# Patient Record
Sex: Male | Born: 1945 | Race: White | Hispanic: No | Marital: Married | Smoking: Former smoker
Health system: Southern US, Community
[De-identification: ages and names within clinical notes are randomized; demographics above are authoritative.]

## PROBLEM LIST (undated history)

## (undated) DIAGNOSIS — I350 Nonrheumatic aortic (valve) stenosis: Secondary | ICD-10-CM

## (undated) DIAGNOSIS — Z87442 Personal history of urinary calculi: Secondary | ICD-10-CM

## (undated) DIAGNOSIS — I2510 Atherosclerotic heart disease of native coronary artery without angina pectoris: Secondary | ICD-10-CM

## (undated) DIAGNOSIS — R519 Headache, unspecified: Secondary | ICD-10-CM

## (undated) DIAGNOSIS — R9431 Abnormal electrocardiogram [ECG] [EKG]: Secondary | ICD-10-CM

## (undated) DIAGNOSIS — G5600 Carpal tunnel syndrome, unspecified upper limb: Secondary | ICD-10-CM

## (undated) DIAGNOSIS — Z973 Presence of spectacles and contact lenses: Secondary | ICD-10-CM

## (undated) DIAGNOSIS — C4490 Unspecified malignant neoplasm of skin, unspecified: Secondary | ICD-10-CM

## (undated) DIAGNOSIS — R51 Headache: Secondary | ICD-10-CM

## (undated) DIAGNOSIS — K4090 Unilateral inguinal hernia, without obstruction or gangrene, not specified as recurrent: Secondary | ICD-10-CM

## (undated) DIAGNOSIS — E785 Hyperlipidemia, unspecified: Secondary | ICD-10-CM

## (undated) DIAGNOSIS — R011 Cardiac murmur, unspecified: Secondary | ICD-10-CM

## (undated) DIAGNOSIS — M1990 Unspecified osteoarthritis, unspecified site: Secondary | ICD-10-CM

## (undated) HISTORY — PX: MOUTH SURGERY: SHX715

## (undated) HISTORY — DX: Cardiac murmur, unspecified: R01.1

## (undated) HISTORY — PX: CARDIAC CATHETERIZATION: SHX172

## (undated) HISTORY — PX: CORONARY STENT PLACEMENT: SHX1402

## (undated) HISTORY — PX: TONSILLECTOMY: SUR1361

## (undated) HISTORY — PX: APPENDECTOMY: SHX54

## (undated) HISTORY — DX: Unspecified malignant neoplasm of skin, unspecified: C44.90

## (undated) HISTORY — DX: Atherosclerotic heart disease of native coronary artery without angina pectoris: I25.10

## (undated) HISTORY — DX: Carpal tunnel syndrome, unspecified upper limb: G56.00

## (undated) HISTORY — PX: OTHER SURGICAL HISTORY: SHX169

## (undated) HISTORY — DX: Hyperlipidemia, unspecified: E78.5

## (undated) HISTORY — DX: Nonrheumatic aortic (valve) stenosis: I35.0

---

## 2001-03-28 HISTORY — PX: HERNIA REPAIR: SHX51

## 2004-04-13 ENCOUNTER — Ambulatory Visit (HOSPITAL_COMMUNITY): Admission: RE | Admit: 2004-04-13 | Discharge: 2004-04-14 | Payer: Self-pay | Admitting: Cardiology

## 2004-04-13 ENCOUNTER — Encounter: Admission: RE | Admit: 2004-04-13 | Discharge: 2004-04-13 | Payer: Self-pay | Admitting: Cardiology

## 2004-04-26 ENCOUNTER — Encounter (HOSPITAL_COMMUNITY): Admission: RE | Admit: 2004-04-26 | Discharge: 2004-07-25 | Payer: Self-pay | Admitting: Cardiology

## 2004-10-26 DIAGNOSIS — I2510 Atherosclerotic heart disease of native coronary artery without angina pectoris: Secondary | ICD-10-CM

## 2004-10-26 HISTORY — DX: Atherosclerotic heart disease of native coronary artery without angina pectoris: I25.10

## 2006-02-22 ENCOUNTER — Encounter: Admission: RE | Admit: 2006-02-22 | Discharge: 2006-02-22 | Payer: Self-pay | Admitting: Cardiology

## 2006-02-24 ENCOUNTER — Ambulatory Visit (HOSPITAL_COMMUNITY): Admission: RE | Admit: 2006-02-24 | Discharge: 2006-02-25 | Payer: Self-pay | Admitting: Cardiology

## 2010-08-13 NOTE — Cardiovascular Report (Signed)
NAME:  Dillon Rios, Dillon Rios NO.:  [id number]   MEDICAL RECORD NO.:  [id number]          PATIENT TYPE:  OIB   LOCATION:  2857                         FACILITY:  [facility]   PHYSICIAN:  Armanda Magic, M.D.     DATE OF BIRTH:  03/11/1946   DATE OF PROCEDURE:  04/13/2004  DATE OF DISCHARGE:                              CARDIAC CATHETERIZATION   REFERRING PHYSICIAN:  Dellis Anes. Idell Pickles, M.D.   PROCEDURES:  1.  Left heart catheterization.  2.  Coronary angiography.  3.  Left ventriculography.   OPERATOR:  Armanda Magic, M.D.   INDICATIONS:  Chest pain, shortness of breath and abnormal Cardiolite.   COMPLICATIONS:  None.   ACCESS:  IV access via right femoral artery with 6 French sheath.   This is a 64 year old white male with no previous cardiac history, except  for hyperlipidemia, who has been having problems with carpal tunnel  syndrome.  Around Thanksgiving, he started developing some episodic chest  pain and shortness of breath.  He basically notes that he [place] he went walking up a hill and developed some  shortness of breath and some slight chest pressure that resolved with rest.  He has really not had any problems since then, except for one episode in  [place], [place], as well.  He now presents for heart  catheterization because of abnormal Cardiolite showing anteroseptal and  anteroapical ischemia.   The patient was brought to the cardiac catheterization laboratory in a  fasting, nonsedated state.  Informed consent was obtained.  The patient was  connected to continuous heart rate and pulse oximetry monitoring and  intermittent blood pressure monitoring.  The right groin was prepped and  draped in a sterile fashion.  One percent Xylocaine was used for local  anesthesia.  Using a modified Seldinger technique, a 6 French sheath was  placed in the right femoral artery.  Under fluoroscopic guidance, a 6 [place]  JL4  catheter was placed in the left coronary artery.  Multiple cine films  were taken at 30 degree LAO and 40 degree RAO views.  This catheter was then  exchanged out over a guide wire for a 6 [place] JL4 catheter which was placed  under fluoroscopic guidance in the right coronary artery.  Multiple cine  films were taken at 30 degree RAO and 40 degree LAO views.  This catheter  was then exchanged out over a guide wire for a 6 French angled pigtail  catheter which was placed under fluoroscopic guidance in the left  ventricular cavity.  Left ventriculography was performed in the 30 degree  RAO view and a total of 30 mL of contrast at 15 mL/sec.  The catheter was  then pulled back across the aortic valve with no significant gradient noted.  At the end of the procedure, the patient went on to angioplasty per Dr.  Amil Amen.   RESULTS:  The left main coronary artery is widely patent.  There is a 30%  distal lesion in the left main.  The left main then bifurcates into a  left  anterior descending artery and left circumflex artery.  The left anterior  descending artery gives rise to a first diagonal branch which is widely  patent.  There is then between the first and second diagonals in the  proximal to mid portion of the vessel a 95% stenosis.  Just after the  takeoff of the second diagonal, the LAD becomes occluded.  The second  diagonal was patent.  The left circumflex has a 50% proximal to mid lesion.  It then gives rise to two obtuse marginal branches.  The second obtuse  marginal branch has a 50% narrowing.  The rest of the vessel is patent.   The right coronary artery is patent proximally.  It gives rise to a large RV  marginal branch and then just after the takeoff of the RV marginal branch  there is an 80-90% focal stenosis.  The ongoing right coronary artery is  widely patent and bifurcates into a posterior descending artery and  posterolateral artery, both of which are widely patent.  There  is a 30%  narrowing of the proximal RCA.   Left ventriculography shows normal LV systolic function and EF 60%.  The  aortic pressure is 110/66 mmHg and LV pressure 119/70 mmHg.   ASSESSMENT:  1.  Chest pain, shortness of breath and abnormal Cardiolite.  2.  Two-vessel obstructive coronary artery disease.  3.  Normal left ventricular function.   PLAN:  1.  PCI of the LAD plus or minus the right coronary artery.  This will be      decided by Dr. Amil Amen.  2.  Aspirin and Plavix.  3.  Check a fasting lipid panel.      TT/MEDQ  D:  04/13/2004  T:  04/13/2004  Job:  [id number]   cc:   Dellis Anes. Idell Pickles, M.D.  [address]  [place]  [place] [address]  Fax: [phone]

## 2010-08-13 NOTE — Cardiovascular Report (Signed)
NAME:  Dillon Rios, Dillon Rios NO.:  [id number]   MEDICAL RECORD NO.:  [id number]          PATIENT TYPE:  INP   LOCATION:  6525                         FACILITY:  [facility]   PHYSICIAN:  Armanda Magic, M.D.     DATE OF BIRTH:  [birth year]   DATE OF PROCEDURE:  02/24/2006  DATE OF DISCHARGE:  02/25/2006                            CARDIAC CATHETERIZATION   REFERRING PHYSICIAN:  Not written down.   PROCEDURE:  1. Left heart catheterization.  2. Coronary angiography.  3. Left ventriculography.   OPERATOR:  Armanda Magic, MD   INDICATIONS:  Chest pain and abnormal Cardiolite.   COMPLICATIONS:  None.   IV ACCESS:  Via the right femoral artery 6-French sheath.   IV MEDICATIONS:  1. Versed 2 mg IV.  2. Heparin 3000 units IV.   This is a very pleasant 65 year old white male who is status post PTCA  stenting of the LAD and RCA almost 2 years ago.  He now presents with  exertional chest pain and back pain and abnormal Cardiolite showing  reversible defect in the anterior wall consistent with ischemia in the  LAD territory.  He presents for cardiac catheterization.   The patient is brought to the cardiac catheterization laboratory in the  fasting nonsedated state.  Informed consent was obtained.  The patient  was connected to continuous heart rate and pulse oximetry monitoring and  intermittent blood pressure monitoring.  The right groin was prepped and  draped in a sterile fashion.  Xylocaine 1% was used for local  anesthesia.  Using the modified Seldinger technique, a 6-French sheath  was placed in the right femoral artery.  Under fluoroscopic guidance, a  6-French JL-4 catheter was placed in the right femoral artery.  Under  fluoroscopic guidance, a 6-French JL-4 catheter was placed in left  coronary artery.  Multiple cine films were taken in 30-degree RAO 40-  degree LAO views.  This catheter was then exchanged over a guidewire for  6-French JR-4 catheter which was placed  under fluoroscopic guidance to  the right coronary artery.  Multiple cine films were taken at 30-degree  RAO 40-degree LAO views.  This catheter was then exchanged out over a  guidewire for a 6-French angled pigtail catheter which was placed in  fluoroscopic guidance in the left ventricular cavity.  Left  ventriculography was performed in the 30-degree RAO view using total of  30 mL contrast at 15 mL per second.  Catheter was then pulled back  across the aortic valve with no significant gradient noted.  At the end  of the procedure, the sheath was sutured in place, and the patient was  given 3000 unit bolus of heparin and then started on a heparin drip of  1000 U/hr and subsequently went on to PCI of the LAD per Dr. Amil Amen.   RESULTS:  Left main coronary artery is widely patent and bifurcates in  the left anterior descending artery and left circumflex artery.   The left anterior descending artery has a stent in the proximal portion  which is subtotaled in the proximal portion of the stent with  TIMI 1  flow distally.  At the distal portion of the stent, there is a takeoff  of a first diagonal which appears patent.   The left circumflex is patent throughout its course with a 30-40%  eccentric narrowing in the proximal portion and in the proximal to  midportion, there is a 40% narrowing before the takeoff of a first large  obtuse marginal branch which was widely patent.  Just after the takeoff  of the first obtuse marginal branch, there is a 50% narrowing of the  distal left circumflex before giving rise to a second obtuse marginal  branch which is widely patent.   The right coronary is widely patent throughout its course including the  stent and bifurcates distally in a posterior descending artery and  posterolateral artery, both of which are widely patent.   Left ventriculography shows normal LV systolic function, EF 55% with  mild anterolateral hypokinesis.  LV pressure 108/6 mmHg,  aortic pressure  117/68 mmHg.   ASSESSMENT:  1. One-vessel obstructive coronary disease left anterior descending      stent is subtotaled.  2. Normal left ventricular function.  3. Chest pain.   PLAN:  PCI of the left anterior descending today by Dr. Amil Amen, aspirin  and Plavix.  Check a fasting lipid panel.  We will suture the sheath in  place and give 3000 units of IV heparin and start on heparin drip at  1000 U/hr until the angioplasty.      Armanda Magic, M.D.  Electronically Signed     TT/MEDQ  D:  02/24/2006  T:  02/25/2006  Job:  [id number]

## 2010-08-13 NOTE — Cardiovascular Report (Signed)
NAME:  Dillon Rios, PRESTAGE NO.:  [id number]   MEDICAL RECORD NO.:  [id number]          PATIENT TYPE:  OIB   LOCATION:  2899                         FACILITY:  [facility]   PHYSICIAN:  Armanda Magic, M.D.     DATE OF BIRTH:  1945/09/25   DATE OF PROCEDURE:  DATE OF DISCHARGE:                            CARDIAC CATHETERIZATION   Audio too short to transcribe (less than 5 seconds)      Armanda Magic, M.D.     TT/MEDQ  D:  02/24/2006  T:  02/24/2006  Job:  [id number]

## 2010-08-13 NOTE — Cardiovascular Report (Signed)
NAME:  Dillon Rios, Dillon Rios NO.:  [id number]   MEDICAL RECORD NO.:  [id number]          PATIENT TYPE:  INP   LOCATION:  2807                         FACILITY:  [facility]   PHYSICIAN:  Francisca December, M.D.  DATE OF BIRTH:  Sep 28, 1945   DATE OF PROCEDURE:  02/24/2006  DATE OF DISCHARGE:                            CARDIAC CATHETERIZATION   PROCEDURES PERFORMED:  1. Percutaneous coronary intervention/drug eluting stent implantation      distal left anterior descending.  2. Balloon dilatation mid left anterior descending.  3. Intravascular ultrasound.   INDICATIONS:  Dillon Rios is a 65 year old man who is 23 months  his last the PCI/drug eluting stent implantation in the right coronary  artery and anterior descending arteries.  He has had recurrent angina  for the past few months.  A recent myocardial perfusion study showed  reversible anterior ischemia.  Dr. Armanda Magic completed coronary  angiography revealing complete occlusion of the anterior descending  artery in the proximal segment.  There is trivial antegrade flow.  He is  to go undergo catheter based revascularization at this time.   PROCEDURE NOTE:  Via the previously placed 6-French catheter sheath, a  wire was inserted into the femoral artery and the sheath was replaced  with a second 6-French catheter sheath under sterile conditions.  A 6-  French 3.5 CLS guiding catheter was then advanced in the ascending aorta  where the left coronary os was engaged.  The patient received 0.75 mg/kg  bolus of bivalirudin followed by a constant infusion of 1.75 mg/kg per  hour.  The resultant ACT was 384 seconds.  A 0.014 inches Luge  intracoronary guidewire was passed across the lesion and into the distal  LAD without difficulty.  The lesion then underwent intravascular  ultrasound using the [facility] Scientific Atlantis catheter.  The images  were then analyzed and initial balloon dilatation performed with a  2.5/20  mm Maverick intracoronary balloon.  This was inflated to a peak  pressure of 6 atmospheres for a peak duration of 54 seconds.  The  balloon was deflated and removed and it was followed by a 3/15 mm Scimed  Quantum Maverick intracoronary balloon.  This was inflated in the more  distal and then the more proximal portion of the stented segment to 12  and then 18 atmospheres respectively for 49 and 78 seconds respectively.  This balloon was removed and intravascular ultrasound repeated.  There  was still not adequate dilatation/expansion of the stent in the proximal  portion secondary to extensive calcium over a 270 arc.  A 3/8 mm Quantum  Maverick was then chosen and advanced in the more proximal portion of  the stent.  This was inflated ultimately to 22 atmospheres for 39  seconds.   There did appear to be significant stenosis distal to the stented  segment after restoration of antegrade flow.  This occurred after the  initial balloon dilatation.  Angiography and intravascular ultrasound of  this region documented significant obstruction with a luminal diameter  in the 1 x 1.5 mm range.  I, therefore, elected to place a  second stent  more distally.  However, prior to this, I passed a second Luge into the  diagonal branch and dilated the ostium of this which was at the distal  end of the previously placed stent.  I inflated the balloon to 6  atmospheres for approximately 35 seconds.  This balloon was removed.  The wire was allowed to remain in place and the second stent advanced  into the more distal segment.  This was a 2.75/20 mm Taxus intracoronary  stent.  It was deployed at peak pressure of 12 atmospheres for 51  seconds.  The stent balloon was deflated and removed. Intravascular  ultrasound was performed and it was decided to post dilate with the 3/8  mm Quantum Maverick.  This was positioned in two different portions of  the stent and inflated to a peak pressure of 14 and then 12  atmospheres  for 30 seconds.  This balloon was deflated and removed.  Final  intravascular ultrasound was obtained.  The final cineangiogram was  obtained both with and without the guidewire in place.  The guiding  catheter was then removed.  The sheath was sutured into place.  The  patient was transported to the recovery area in stable condition.   ANGIOGRAPHY:  As mentioned, the initial lesion treated was in the mid  portion of the anterior descending artery and within the stented  segment.  It was completely occluded.  There was trivial antegrade flow.  By intravascular ultrasound, there is extensive thrombosis within the  stented segment.  Following balloon dilatation, there was 10% residual  stenosis by angiography.  In the more distal segment, which was 70%  stenotic by angiography, following balloon dilatation and stent  implantation, there was no residual stenosis.  There was a residual  stenosis in the diagonal branch in the range of 80% but there was TIMI  grade III flow.   Intravascular ultrasound, as mentioned, demonstrated extensive  thrombosis within the stented segment in the mid LAD.  The smallest  diameter measured in the stent was approximately 2.2 mm.  Following  balloon dilatation, the diameter of the stent was 2.5 x 3 mm.  This was  in the more proximal segment.  In the more distal stented segment, the  diameter was 2.8 x 2.9 mm.   FINAL IMPRESSION:  1. Atherosclerotic cardiovascular disease, two vessel.  2. Status post successful balloon dilatation left anterior descending      stent mid and drug-eluting stent implantation in the more distal      portion of the left anterior descending.  3. Typical angina was not reproduced with device insertion and balloon      inflation.      Francisca December, M.D.  Electronically Signed     JHE/MEDQ  D:  02/24/2006  T:  02/25/2006  Job:  [id number]

## 2010-08-13 NOTE — Cardiovascular Report (Signed)
NAME:  Dillon Rios, Dillon Rios NO.:  [id number]   MEDICAL RECORD NO.:  [id number]          PATIENT TYPE:  OIB   LOCATION:  2857                         FACILITY:  [facility]   PHYSICIAN:  Francisca December, M.D.  DATE OF BIRTH:  [birth year]   DATE OF PROCEDURE:  DATE OF DISCHARGE:                              CARDIAC CATHETERIZATION   PROCEDURES PERFORMED:  1.  Percutaneous coronary intervention/drug-eluting stent implantation mid      left anterior descending artery.  2.  Percutaneous coronary intervention/drug-eluting stent implantation mid      right coronary artery.  3.  Percutaneous closure right femoral artery.   INDICATIONS:  [name] is a 65 year old man who was referred to  Dr. Armanda Magic because of progressive atypical angina.  She performed  myocardial perfusion scintigraphy which revealed a markedly reversible  anteroseptal defect.  She has completed coronary angiography revealing a  subtotal stenosis with TIMI grade I distal flow in the LAD.  The lesion is  in the mid LAD.  There is also a subtotal stenosis of the right coronary  artery in the proximal mid segment.  The patient is to undergo catheter-  based revascularization at this time.   PROCEDURAL NOTE:  Via the previously placed 6 French catheter sheath, a 6  [place] #3.5 CLS guiding catheter was advanced to the ascending aorta where  the left coronary os was engaged.  The patient received 4300 units of  heparin and a bolus of Aggrastat at 25 mcg/kg as well as constant infusion.  The resultant ACT was 303 seconds.  A 0.014 inch Scimed Luge intercoronary  guidewire was passed across the lesion in the mid LAD without difficulty.  Initial balloon dilatation was performed with a 2.5/20 millimeter Scimed  Maverick intercoronary balloon.  This was inflated to 6 atmospheres for 60  seconds.  This device was removed and replaced with a 2.75/16 millimeter  Scimed Taxus Express 2 intercoronary drug-eluting  stent.  This device was  carefully placed across the lesion and inflated to a peak pressure of 12  atmospheres for 55 seconds.  Following confirmation of adequate patency in  orthogonal views both with and without the guidewire in place, the guiding  catheter was removed.  It was exchanged for a 6 [place] #4 right Judkins  guiding catheter.  A 0.014 inch Luge again was used to cross the lesion in  the mid right coronary, initial balloon dilatation was performed with a  2.5/20 millimeter Scimed Maverick.  This was inflated to 10 atmospheres for  57 seconds.  This balloon was deflated and removed and a 3.5/13 millimeter  Cortis CYPHER intercoronary drug-eluting stent was advanced and carefully  placed across the lesion and it was deployed at a peak pressure of 18  atmospheres for 53 seconds.  The stent balloon was removed and 200 mcg of  intercoronary nitroglycerin administered to resolve intercoronary spasm just  distal to this stented segment.  This was successful.  A final post  dilatation was performed with a 3.5/8 millimeter Quantum Maverick  intercoronary balloon.  This was carefully placed in the mid  portion of the  stent and inflated to 10 atmospheres for 43 seconds.  Again, following  confirmation of adequate patency in orthogonal views both with and without  the guidewire in place, the guiding catheter was removed.  A 45 degree RAO  right femoral arteriogram was performed via hand injection.  It confirmed  the arteriotomy site to be well above the bifurcation into the profunda  femoris and superficial femoral arteries.  There was no significant  atherosclerotic disease in the femoral artery.  The Angioseal was  successfully deployed without difficulty with good hemostasis and intact  distal pulse.   Angiography -- as mentioned the lesions treated were in the mid portion of  the LAD and in the right coronary.  The LAD stenosis was 99% with decreased  distal flow.  Following balloon  dilatation and stent implantation there was  no residual stenosis and distal flow was TIMI grade III.  Following balloon  dilatation and stent implantation in the mid right coronary, there again was  no residual stenosis.   FINAL IMPRESSION:  1.  Atherosclerotic cardiovascular disease, two-vessel.  2.  Status post successful percutaneous intervention mid left anterior      descending artery and mid right coronaries.  3.  Typical angina was not reproduced with device insertion and balloon      inflation.                                               ______________________________  Francisca December, M.D.  Electronically Signed 04/19/2004 01:55:55pm EST    JHE/MEDQ  D:  04/13/2004  T:  04/13/2004  Job:  [id number]   cc:   Dellis Anes. Idell Pickles, M.D.  [address]  [place]  [place] [address]  Fax: [phone]

## 2010-08-20 ENCOUNTER — Other Ambulatory Visit: Payer: Self-pay | Admitting: Family Medicine

## 2010-08-20 DIAGNOSIS — R220 Localized swelling, mass and lump, head: Secondary | ICD-10-CM

## 2010-08-24 ENCOUNTER — Ambulatory Visit
Admission: RE | Admit: 2010-08-24 | Discharge: 2010-08-24 | Disposition: A | Discharge status: Home / Self Care | Payer: 59 | Source: Ambulatory Visit | Attending: Family Medicine | Admitting: Family Medicine

## 2010-08-24 DIAGNOSIS — R221 Localized swelling, mass and lump, neck: Secondary | ICD-10-CM

## 2010-08-26 ENCOUNTER — Other Ambulatory Visit: Payer: Self-pay | Admitting: Family Medicine

## 2010-08-26 DIAGNOSIS — L989 Disorder of the skin and subcutaneous tissue, unspecified: Secondary | ICD-10-CM

## 2010-09-01 ENCOUNTER — Ambulatory Visit
Admission: RE | Admit: 2010-09-01 | Discharge: 2010-09-01 | Disposition: A | Discharge status: Home / Self Care | Payer: 59 | Source: Ambulatory Visit | Attending: Family Medicine | Admitting: Family Medicine

## 2010-09-01 DIAGNOSIS — L989 Disorder of the skin and subcutaneous tissue, unspecified: Secondary | ICD-10-CM

## 2010-09-01 MED ORDER — IOHEXOL 300 MG/ML  SOLN: 75.0000 mL | Freq: Once | INTRAMUSCULAR | Status: AC | PRN

## 2013-04-11 ENCOUNTER — Encounter: Payer: Self-pay | Admitting: General Surgery

## 2013-04-11 ENCOUNTER — Telehealth: Payer: Self-pay | Admitting: Cardiology

## 2013-04-11 ENCOUNTER — Telehealth: Payer: Self-pay | Admitting: Internal Medicine

## 2013-04-11 NOTE — Telephone Encounter (Signed)
New message   Need a note from [name] stating patient in good health. - [employer] .   Fax #  [phone] or  [phone] attention : [facility] prime care [facility] branch.

## 2013-04-11 NOTE — Telephone Encounter (Signed)
Printed records for Dr Radford Pax

## 2013-04-11 NOTE — Telephone Encounter (Signed)
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 Please advise by faxing back to [phone] or [phone]

## 2013-04-11 NOTE — Telephone Encounter (Signed)
Patient was seen by me on 09/24/2012 for cardiac followup. He was doing well with no chest pain, SOB or palpitations. He has not had any angina since I saw him.  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

## 2013-04-11 NOTE — Telephone Encounter (Signed)
Please get last OV note 

## 2013-04-11 NOTE — Telephone Encounter (Signed)
To Dr Turner to advise 

## 2013-04-11 NOTE — Telephone Encounter (Signed)
Faxed over for pt.

## 2013-04-11 NOTE — Telephone Encounter (Signed)
Pt states he has not had any chest pain or SOB to report to Dr Radford Pax

## 2013-04-11 NOTE — Telephone Encounter (Signed)
Patient was seen by me on 09/24/2012 for cardiac followup.  He was doing well with no chest pain, SOB or palpitations.  He has not had any angina.

## 2013-04-11 NOTE — Telephone Encounter (Signed)
Sent new Letter for pt.

## 2013-04-11 NOTE — Telephone Encounter (Signed)
Last saw pt at [facility] office in 08/2012. His info is in the to be signed folder

## 2013-04-11 NOTE — Telephone Encounter (Signed)
Pt signed ROI, Nichole Faxed me Records Called Pt made aware Ready for Pick Up 1.15.15/kdm

## 2013-09-09 ENCOUNTER — Encounter: Payer: Self-pay | Admitting: Cardiology

## 2013-09-09 ENCOUNTER — Encounter: Payer: Self-pay | Admitting: General Surgery

## 2013-09-09 ENCOUNTER — Ambulatory Visit (INDEPENDENT_AMBULATORY_CARE_PROVIDER_SITE_OTHER): Payer: Medicare Other | Admitting: Cardiology

## 2013-09-09 VITALS — BP 142/76 | HR 67 | Ht 66.0 in | Wt 177.8 lb

## 2013-09-09 DIAGNOSIS — I2510 Atherosclerotic heart disease of native coronary artery without angina pectoris: Secondary | ICD-10-CM | POA: Insufficient documentation

## 2013-09-09 DIAGNOSIS — E782 Mixed hyperlipidemia: Secondary | ICD-10-CM

## 2013-09-09 MED ORDER — ASPIRIN EC 81 MG PO TBEC: 81.0000 mg | DELAYED_RELEASE_TABLET | Freq: Every day | ORAL | Status: AC

## 2013-09-09 NOTE — Patient Instructions (Signed)
Your physician has recommended you make the following change in your medication: 1. Decrease Aspirin to 81 MG 1 tablet daily  Your physician recommends that you return for a FASTING lipid profile and ALT. Please schedule with check out before leaving today  Your physician wants you to follow-up in: 1 year with Dr Mallie Snooks will receive a reminder letter in the mail two months in advance. If you don't receive a letter, please call our office to schedule the follow-up appointment.

## 2013-09-09 NOTE — Progress Notes (Signed)
[place], [address] [place], [place]  [address] Phone: [phone] Fax:  [phone]  Date:  09/09/2013   ID:  Dillon Rios, Dillon Rios 1945/04/13, MRN [id number]  PCP:  Cari Caraway, MD  Cardiologist:  Fransico Him, MD     History of Present Illness: Dillon Rios is a 68 y.o. male with a history of ASCAD with PCI of LAD and RCA in 2006 and then again in LAD for restenosis in 2007, dyslipidemia who presents today for followup.  He is doing well.  He denies any chest pain, SOB, DOE, LE edema, dizziness, palpitations or syncope.   Wt Readings from Last 3 Encounters:  09/09/13 177 lb 12.8 oz (80.65 kg)     Past Medical History  Diagnosis Date  . Hyperlipidemia   . Carpal tunnel syndrome     Bilateral  . Kidney stones     Dr Luanne Bras  . BPH (benign prostatic hypertrophy)   . Skin cancer     right scalp Dr Wilhemina Bonito  . Coronary artery disease 10/26/2004    drug eluting stent placement to the mild LAD and RCA  2006/ PCI of LAD for restenosis 02/24/2006-Dr Turner    Current Outpatient Prescriptions  Medication Sig Dispense Refill  . aspirin 325 MG tablet Take 325 mg by mouth daily.      . clopidogrel (PLAVIX) 75 MG tablet Take 75 mg by mouth daily.      . Multiple Vitamin (MULTIVITAMIN) tablet Take 1 tablet by mouth daily.      Marland Kitchen NAPROXEN 375 MG TBEC EC tablet Take 375 mg by mouth daily.       . nitroGLYCERIN (NITROSTAT) 0.4 MG SL tablet Place 0.4 mg under the tongue every 5 (five) minutes as needed for chest pain.      . Omega-3 Fatty Acids (FISH OIL) 1000 MG CAPS Take 1 capsule by mouth 2 (two) times daily.       . simvastatin (ZOCOR) 80 MG tablet Take 80 mg by mouth daily.       No current facility-administered medications for this visit.    Allergies:    Allergies  Allergen Reactions  . Mistletoe [Viscum Album] Hives    Social History:  The patient  reports that he quit smoking about 26 years ago. He does not have any smokeless tobacco history on file.  He reports that he drinks alcohol. He reports that he does not use illicit drugs.   Family History:  The patient's family history includes Breast cancer in his sister; CAD in his mother; Heart attack in his father.   ROS:  Please see the history of present illness.      All other systems reviewed and negative.   PHYSICAL EXAM: VS:  BP 142/76  Pulse 67  Ht 5\' 6"  (1.676 m)  Wt 177 lb 12.8 oz (80.65 kg)  BMI 28.71 kg/m2 Well nourished, well developed, in no acute distress HEENT: normal Neck: no JVD Cardiac:  normal S1, S2; RRR; no murmur Lungs:  clear to auscultation bilaterally, no wheezing, rhonchi or rales Abd: soft, nontender, no hepatomegaly Ext: no edema Skin: warm and dry Neuro:  CNs 2-12 intact, no focal abnormalities noted  EKG:     NSR with LAD  ASSESSMENT AND PLAN:  1. ASCAD with no angina - decrease ASA to 81mg  daily - continue Plavix 2. Dyslipidemia  - continue Zocor - check fasting lipid and ALT  Followup with me in 1 year  Signed, Traci  Radford Pax, MD 09/09/2013 4:26 PM

## 2013-09-13 ENCOUNTER — Other Ambulatory Visit (INDEPENDENT_AMBULATORY_CARE_PROVIDER_SITE_OTHER): Payer: Medicare Other

## 2013-09-13 DIAGNOSIS — E782 Mixed hyperlipidemia: Secondary | ICD-10-CM

## 2013-09-13 LAB — LIPID PANEL
CHOL/HDL RATIO: 3
Cholesterol: 138 mg/dL (ref 0–200)
HDL: 46.1 mg/dL (ref >39.00)
LDL CALC: 74 mg/dL (ref 0–99)
NonHDL: 91.9
TRIGLYCERIDES: 91 mg/dL (ref 0.0–149.0)
VLDL: 18.2 mg/dL (ref 0.0–40.0)

## 2013-09-13 LAB — ALT: ALT: 13 U/L (ref 0–53)

## 2013-09-16 ENCOUNTER — Encounter: Payer: Self-pay | Admitting: General Surgery

## 2013-09-23 ENCOUNTER — Ambulatory Visit: Payer: 59 | Admitting: Cardiology

## 2014-06-06 ENCOUNTER — Encounter: Payer: Self-pay | Admitting: Cardiology

## 2014-10-27 NOTE — Progress Notes (Signed)
Cardiology Office Note   Date:  10/28/2014   ID:  Dillon Rios, DOB 1945/08/29, MRN [id number]  PCP:  Cari Caraway, MD    No chief complaint on file.     History of Present Illness: Dillon Rios is a 69 y.o. male with a history of ASCAD with PCI of LAD and RCA in 2006 and then again in LAD for restenosis in 2007, dyslipidemia who presents today for followup. He is doing well. He denies any SOB, DOE, LE edema, dizziness, palpitations or syncope.  Earlier this summer he had been playing several rounds of golf and then went to [place] dance that night.  He danced very hard and had some chest tightness that lasted about 30 minutes and subsided. Since then he has had some frequent episodes of belching with gas.  He has no associated nausea and diaphoresis with the discomfort but did have some mild DOE.  He has been able to mow the yard with no problems.      Past Medical History  Diagnosis Date  . Hyperlipidemia   . Carpal tunnel syndrome     Bilateral  . Kidney stones     Dr Luanne Bras  . BPH (benign prostatic hypertrophy)   . Skin cancer     right scalp Dr Wilhemina Bonito  . Coronary artery disease 10/26/2004    drug eluting stent placement to the mild LAD and RCA  2006/ PCI of LAD for restenosis 02/24/2006-Dr Matilde Pottenger    Past Surgical History  Procedure Laterality Date  . Hernia repair  2003  . Coronary stent placement  2006 and 2007  . Tonsillectomy      age 48  . Appendectomy      age 21  . Melanoma removal      from left forearm-Dr [facility] 2013     Current Outpatient Prescriptions  Medication Sig Dispense Refill  . aspirin EC 81 MG tablet Take 1 tablet (81 mg total) by mouth daily.    . clopidogrel (PLAVIX) 75 MG tablet Take 75 mg by mouth daily.    . Multiple Vitamin (MULTIVITAMIN) tablet Take 1 tablet by mouth daily.    Marland Kitchen NAPROXEN 375 MG TBEC EC tablet Take 375 mg by mouth every 6 (six) hours as needed.     . nitroGLYCERIN (NITROSTAT)  0.4 MG SL tablet Place 0.4 mg under the tongue every 5 (five) minutes as needed for chest pain.    . Omega-3 Fatty Acids (FISH OIL) 1000 MG CAPS Take 1 capsule by mouth 2 (two) times daily.     . simvastatin (ZOCOR) 80 MG tablet Take 80 mg by mouth daily.     No current facility-administered medications for this visit.    Allergies:   Mistletoe    Social History:  The patient  reports that he quit smoking about 27 years ago. He does not have any smokeless tobacco history on file. He reports that he drinks alcohol. He reports that he does not use illicit drugs.   Family History:  The patient's 12family history includes Breast cancer in his sister; CAD in his mother; Heart attack in his father.    ROS:  Please see the history of present illness.   Otherwise, review of systems are positive for none.   All other systems are reviewed and negative.    PHYSICAL EXAM: VS:  BP 124/60 mmHg  Pulse 75  Ht 5\' 6"  (1.676 m)  Wt 180 lb (81.647 kg)  BMI 29.07 kg/m2 , BMI Body mass index is 29.07 kg/(m^2). GEN: Well nourished, well developed, in no acute distress HEENT: normal Neck: no JVD, carotid bruits, or masses Cardiac: RRR; no murmurs, rubs, or gallops,no edema  Respiratory:  clear to auscultation bilaterally, normal work of breathing GI: soft, nontender, nondistended, + BS MS: no deformity or atrophy Skin: warm and dry, no rash Neuro:  Strength and sensation are intact Psych: euthymic mood, full affect   EKG:  EKG is ordered today. The ekg ordered today demonstrates NSR with LAFB and no ST changes   Recent Labs: No results found for requested labs within last 365 days.    Lipid Panel    Component Value Date/Time   CHOL 138 09/13/2013 0743   TRIG 91.0 09/13/2013 0743   HDL 46.10 09/13/2013 0743   CHOLHDL 3 09/13/2013 0743   VLDL 18.2 09/13/2013 0743   LDLCALC 74 09/13/2013 0743      Wt Readings from Last 3 Encounters:  10/28/14 180 lb (81.647 kg)  09/09/13 177 lb 12.8 oz  (80.65 kg)    ASSESSMENT AND PLAN:  1. ASCAD with one episode of ? Angina.  I have recommended that we do a stress myoview to rule out ischemia.   - continue Plavix/ASA 2. Dyslipidemia - continue Zocor - check fasting lipid and ALT at time of stress test     Current medicines are reviewed at length with the patient today.  The patient does not have concerns regarding medicines.  The following changes have been made:  no change  Labs/ tests ordered today: See above Assessment and Plan No orders of the defined types were placed in this encounter.     Disposition:   FU with me in 1 year  Signed, Sueanne Margarita, MD  10/28/2014 4:34 PM    [facility] [place], [place], [place]  [address] Phone: [phone]; Fax: [phone]

## 2014-10-28 ENCOUNTER — Ambulatory Visit (INDEPENDENT_AMBULATORY_CARE_PROVIDER_SITE_OTHER): Payer: Medicare Other | Admitting: Cardiology

## 2014-10-28 ENCOUNTER — Encounter: Payer: Self-pay | Admitting: Cardiology

## 2014-10-28 VITALS — BP 124/60 | HR 75 | Ht 66.0 in | Wt 180.0 lb

## 2014-10-28 DIAGNOSIS — E782 Mixed hyperlipidemia: Secondary | ICD-10-CM

## 2014-10-28 DIAGNOSIS — I2510 Atherosclerotic heart disease of native coronary artery without angina pectoris: Secondary | ICD-10-CM

## 2014-10-28 DIAGNOSIS — R079 Chest pain, unspecified: Secondary | ICD-10-CM | POA: Diagnosis not present

## 2014-10-28 NOTE — Patient Instructions (Signed)
Medication Instructions:  Your physician recommends that you continue on your current medications as directed. Please refer to the Current Medication list given to you today.   Labwork: None  Testing/Procedures: [name] recommends you have an Metaline.  Follow-Up: Your physician wants you to follow-up in: 1 year with [name]. You will receive a reminder letter in the mail two months in advance. If you don't receive a letter, please call our office to schedule the follow-up appointment.   Any Other Special Instructions Will Be Listed Below (If Applicable).

## 2014-11-03 ENCOUNTER — Telehealth (HOSPITAL_COMMUNITY): Payer: Self-pay

## 2014-11-03 NOTE — Telephone Encounter (Signed)
Patient given detailed instructions per Myocardial Perfusion Study Information Sheet for test on 11-05-2014 at 0730. Patient Notified to arrive 15 minutes early, and that it is imperative to arrive on time for appointment to keep from having the test rescheduled. Patient verbalized understanding. Oletta Lamas, Vikki Gains A

## 2014-11-03 NOTE — Telephone Encounter (Signed)
Encounter complete. 

## 2014-11-05 ENCOUNTER — Ambulatory Visit (HOSPITAL_COMMUNITY): Payer: Medicare Other | Attending: Cardiology

## 2014-11-05 DIAGNOSIS — R079 Chest pain, unspecified: Secondary | ICD-10-CM

## 2014-11-05 LAB — MYOCARDIAL PERFUSION IMAGING
CHL CUP MPHR: 152 {beats}/min
CHL CUP NUCLEAR SSS: 0
CHL RATE OF PERCEIVED EXERTION: 15
CSEPED: 9 min
CSEPEW: 10.1 METS
LHR: 0.28
LVDIAVOL: 99 mL
LVSYSVOL: 38 mL
Peak HR: 149 {beats}/min
Percent HR: 98 %
Rest HR: 63 {beats}/min
SDS: 0
SRS: 0
TID: 0.98

## 2014-11-05 MED ORDER — TECHNETIUM TC 99M SESTAMIBI GENERIC - CARDIOLITE
10.3000 | Freq: Once | INTRAVENOUS | Status: AC | PRN
  Administered 2014-11-05: 10 via INTRAVENOUS

## 2014-11-05 MED ORDER — TECHNETIUM TC 99M SESTAMIBI GENERIC - CARDIOLITE
32.1000 | Freq: Once | INTRAVENOUS | Status: AC | PRN
  Administered 2014-11-05: 32.1 via INTRAVENOUS

## 2014-11-07 ENCOUNTER — Telehealth: Payer: Self-pay | Admitting: Cardiology

## 2014-11-07 NOTE — Telephone Encounter (Signed)
New message     Pt returning call regarding test results Please call to discuss

## 2014-11-07 NOTE — Telephone Encounter (Signed)
Called patient back about stress test results. Per [name], stress test is fine. Patient verbalized understanding.

## 2014-11-17 ENCOUNTER — Telehealth: Payer: Self-pay | Admitting: Cardiology

## 2014-11-17 NOTE — Telephone Encounter (Signed)
Patient concerned because he st he was unclear about his myoview results and did not know whether he needed to go to the hospital. Apologized to patient his normal results were not made clear. Emphasized low risk results to patient and reviewed in great detail what the myoview shows - that his hear is getting adequate blood flow and he is at low risk for blockage at this time. Patient extremely grateful for callback and explanation.

## 2014-11-17 NOTE — Telephone Encounter (Signed)
New message     Pt calling about results Please call to discuss

## 2015-06-08 ENCOUNTER — Other Ambulatory Visit: Payer: Self-pay | Admitting: Urology

## 2015-06-09 ENCOUNTER — Encounter (HOSPITAL_COMMUNITY): Payer: Self-pay | Admitting: *Deleted

## 2015-06-11 ENCOUNTER — Ambulatory Visit (HOSPITAL_COMMUNITY): Payer: Medicare Other

## 2015-06-11 ENCOUNTER — Ambulatory Visit (HOSPITAL_COMMUNITY): Payer: Medicare Other | Admitting: Anesthesiology

## 2015-06-11 ENCOUNTER — Ambulatory Visit (HOSPITAL_COMMUNITY)
Admission: RE | Admit: 2015-06-11 | Discharge: 2015-06-11 | Disposition: A | Discharge status: Home / Self Care | Payer: Medicare Other | Source: Ambulatory Visit | Attending: Urology | Admitting: Urology

## 2015-06-11 ENCOUNTER — Encounter (HOSPITAL_COMMUNITY)
Admission: RE | Disposition: A | Discharge status: Home / Self Care | Payer: Self-pay | Source: Ambulatory Visit | Attending: Urology

## 2015-06-11 ENCOUNTER — Encounter (HOSPITAL_COMMUNITY): Payer: Self-pay

## 2015-06-11 DIAGNOSIS — N400 Enlarged prostate without lower urinary tract symptoms: Secondary | ICD-10-CM | POA: Diagnosis not present

## 2015-06-11 DIAGNOSIS — N132 Hydronephrosis with renal and ureteral calculous obstruction: Secondary | ICD-10-CM | POA: Insufficient documentation

## 2015-06-11 DIAGNOSIS — R05 Cough: Secondary | ICD-10-CM

## 2015-06-11 DIAGNOSIS — M1990 Unspecified osteoarthritis, unspecified site: Secondary | ICD-10-CM | POA: Diagnosis not present

## 2015-06-11 DIAGNOSIS — E785 Hyperlipidemia, unspecified: Secondary | ICD-10-CM | POA: Insufficient documentation

## 2015-06-11 DIAGNOSIS — I2510 Atherosclerotic heart disease of native coronary artery without angina pectoris: Secondary | ICD-10-CM | POA: Insufficient documentation

## 2015-06-11 DIAGNOSIS — Z87891 Personal history of nicotine dependence: Secondary | ICD-10-CM | POA: Insufficient documentation

## 2015-06-11 DIAGNOSIS — Z87442 Personal history of urinary calculi: Secondary | ICD-10-CM | POA: Insufficient documentation

## 2015-06-11 DIAGNOSIS — Z955 Presence of coronary angioplasty implant and graft: Secondary | ICD-10-CM | POA: Diagnosis not present

## 2015-06-11 DIAGNOSIS — N201 Calculus of ureter: Secondary | ICD-10-CM | POA: Diagnosis present

## 2015-06-11 DIAGNOSIS — R058 Other specified cough: Secondary | ICD-10-CM

## 2015-06-11 HISTORY — PX: CYSTOSCOPY WITH RETROGRADE PYELOGRAM, URETEROSCOPY AND STENT PLACEMENT: SHX5789

## 2015-06-11 HISTORY — DX: Unspecified osteoarthritis, unspecified site: M19.90

## 2015-06-11 LAB — PROTIME-INR
INR: 1.09 (ref 0.00–1.49)
Prothrombin Time: 14.3 seconds (ref 11.6–15.2)

## 2015-06-11 SURGERY — CYSTOURETEROSCOPY, WITH RETROGRADE PYELOGRAM AND STENT INSERTION
Anesthesia: General | Laterality: Left

## 2015-06-11 MED ORDER — HYDROMORPHONE HCL 1 MG/ML IJ SOLN
INTRAMUSCULAR | Status: AC
  Filled 2015-06-11: qty 1

## 2015-06-11 MED ORDER — OXYCODONE HCL 5 MG/5ML PO SOLN
5.0000 mg | Freq: Once | ORAL | Status: DC | PRN
  Filled 2015-06-11: qty 5

## 2015-06-11 MED ORDER — LIDOCAINE HCL (CARDIAC) 20 MG/ML IV SOLN
INTRAVENOUS | Status: DC | PRN
  Administered 2015-06-11: 50 mg via INTRAVENOUS

## 2015-06-11 MED ORDER — HYDROMORPHONE HCL 1 MG/ML IJ SOLN
0.2500 mg | INTRAMUSCULAR | Status: DC | PRN
  Administered 2015-06-11 (×2): 0.5 mg via INTRAVENOUS

## 2015-06-11 MED ORDER — SENNOSIDES-DOCUSATE SODIUM 8.6-50 MG PO TABS: 1.0000 | ORAL_TABLET | Freq: Two times a day (BID) | ORAL | Status: DC

## 2015-06-11 MED ORDER — PROMETHAZINE HCL 25 MG/ML IJ SOLN: 6.2500 mg | INTRAMUSCULAR | Status: DC | PRN

## 2015-06-11 MED ORDER — FENTANYL CITRATE (PF) 100 MCG/2ML IJ SOLN
INTRAMUSCULAR | Status: AC
  Filled 2015-06-11: qty 2

## 2015-06-11 MED ORDER — MIDAZOLAM HCL 5 MG/5ML IJ SOLN
INTRAMUSCULAR | Status: DC | PRN
  Administered 2015-06-11: 2 mg via INTRAVENOUS

## 2015-06-11 MED ORDER — MIDAZOLAM HCL 2 MG/2ML IJ SOLN
INTRAMUSCULAR | Status: AC
  Filled 2015-06-11: qty 2

## 2015-06-11 MED ORDER — PROPOFOL 10 MG/ML IV BOLUS
INTRAVENOUS | Status: AC
  Filled 2015-06-11: qty 20

## 2015-06-11 MED ORDER — FENTANYL CITRATE (PF) 100 MCG/2ML IJ SOLN
INTRAMUSCULAR | Status: DC | PRN
  Administered 2015-06-11: 50 ug via INTRAVENOUS

## 2015-06-11 MED ORDER — PROPOFOL 10 MG/ML IV BOLUS
INTRAVENOUS | Status: DC | PRN
  Administered 2015-06-11: 160 mg via INTRAVENOUS
  Administered 2015-06-11: 4 mg via INTRAVENOUS

## 2015-06-11 MED ORDER — CEFTRIAXONE SODIUM 2 G IJ SOLR
2.0000 g | INTRAMUSCULAR | Status: AC
  Administered 2015-06-11: 2 g via INTRAVENOUS

## 2015-06-11 MED ORDER — DEXTROSE 5 % IV SOLN
INTRAVENOUS | Status: AC
  Filled 2015-06-11: qty 2

## 2015-06-11 MED ORDER — IOHEXOL 300 MG/ML  SOLN
INTRAMUSCULAR | Status: DC | PRN
  Administered 2015-06-11: 5 mL via URETHRAL

## 2015-06-11 MED ORDER — OXYCODONE HCL 5 MG PO TABS: 5.0000 mg | ORAL_TABLET | Freq: Once | ORAL | Status: DC | PRN

## 2015-06-11 MED ORDER — LACTATED RINGERS IV SOLN
INTRAVENOUS | Status: DC | PRN
  Administered 2015-06-11: 13:00:00 via INTRAVENOUS

## 2015-06-11 MED ORDER — OXYCODONE-ACETAMINOPHEN 5-325 MG PO TABS: 1.0000 | ORAL_TABLET | Freq: Four times a day (QID) | ORAL | Status: DC | PRN

## 2015-06-11 MED ORDER — SODIUM CHLORIDE 0.9 % IR SOLN
Status: DC | PRN
  Administered 2015-06-11: 3000 mL

## 2015-06-11 MED ORDER — CEPHALEXIN 500 MG PO CAPS: 500.0000 mg | ORAL_CAPSULE | Freq: Two times a day (BID) | ORAL | Status: DC

## 2015-06-11 SURGICAL SUPPLY — 20 items
BASKET LASER NITINOL 1.9FR (BASKET) ×1 IMPLANT
BASKET STNLS GEMINI 4WIRE 3FR (BASKET) IMPLANT
BASKET ZERO TIP NITINOL 2.4FR (BASKET) IMPLANT
BSKT STON RTRVL 120 1.9FR (BASKET) ×1
BSKT STON RTRVL GEM 120X11 3FR (BASKET)
BSKT STON RTRVL ZERO TP 2.4FR (BASKET)
CATH INTERMIT  6FR 70CM (CATHETERS) ×2 IMPLANT
CLOTH BEACON ORANGE TIMEOUT ST (SAFETY) ×2 IMPLANT
ELECT REM PT RETURN 9FT ADLT (ELECTROSURGICAL)
ELECTRODE REM PT RTRN 9FT ADLT (ELECTROSURGICAL) IMPLANT
FIBER LASER TRAC TIP (UROLOGICAL SUPPLIES) IMPLANT
GLOVE BIOGEL M STRL SZ7.5 (GLOVE) ×2 IMPLANT
GOWN STRL REUS W/TWL XL LVL3 (GOWN DISPOSABLE) ×4 IMPLANT
GUIDEWIRE ANG ZIPWIRE 038X150 (WIRE) ×2 IMPLANT
GUIDEWIRE STR DUAL SENSOR (WIRE) ×2 IMPLANT
IV NS IRRIG 3000ML ARTHROMATIC (IV SOLUTION) ×2 IMPLANT
PACK CYSTO (CUSTOM PROCEDURE TRAY) ×2 IMPLANT
STENT POLARIS 5FRX26 (STENTS) ×1 IMPLANT
SYRINGE IRR TOOMEY STRL 70CC (SYRINGE) IMPLANT
TUBE FEEDING 8FR 16IN STR KANG (MISCELLANEOUS) ×2 IMPLANT

## 2015-06-11 NOTE — Anesthesia Postprocedure Evaluation (Signed)
Anesthesia Post Note  Patient: Dillon Rios  Procedure(s) Performed: Procedure(s) (LRB): CYSTOSCOPY WITH RETROGRADE PYELOGRAM, URETEROSCOPY AND BASKET STONE EXTRACTION STENT PLACEMENT (Left)  Patient location during evaluation: PACU Anesthesia Type: General Level of consciousness: awake and alert Pain management: pain level controlled Vital Signs Assessment: post-procedure vital signs reviewed and stable Respiratory status: spontaneous breathing, nonlabored ventilation, respiratory function stable and patient connected to nasal cannula oxygen Cardiovascular status: blood pressure returned to baseline and stable Postop Assessment: no signs of nausea or vomiting Anesthetic complications: no    Last Vitals:  Filed Vitals:   06/11/15 1045 06/11/15 1352  BP: 163/70 161/97  Pulse: 72 72  Temp: 36.6 C 36.9 C  Resp: 16 15    Last Pain:  Filed Vitals:   06/11/15 1420  PainSc: Asleep                 Maliha Outten S

## 2015-06-11 NOTE — Brief Op Note (Signed)
06/11/2015  1:39 PM  PATIENT:  Dillon Rios  70 y.o. male  PRE-OPERATIVE DIAGNOSIS:  LEFT URETERAL STONE  POST-OPERATIVE DIAGNOSIS:  LEFT URETERAL STONE  PROCEDURE:  Procedure(s): CYSTOSCOPY WITH RETROGRADE PYELOGRAM, URETEROSCOPY AND BASKET STONE EXTRACTION STENT PLACEMENT (Left)  SURGEON:  Surgeon(s) and Role:    * Alexis Frock, MD - Primary  PHYSICIAN ASSISTANT:   ASSISTANTS: none   ANESTHESIA:   general  EBL:     BLOOD ADMINISTERED:none  DRAINS: none   LOCAL MEDICATIONS USED:  NONE  SPECIMEN:  Source of Specimen:  left distal ureteral stone  DISPOSITION OF SPECIMEN:  [facility] for compositional analysis  COUNTS:  YES  TOURNIQUET:  * No tourniquets in log *  DICTATION: .Other Dictation: Dictation Number [id number]  PLAN OF CARE: Discharge to home after PACU  PATIENT DISPOSITION:  PACU - hemodynamically stable.   Delay start of Pharmacological VTE agent (>24hrs) due to surgical blood loss or risk of bleeding: yes

## 2015-06-11 NOTE — Discharge Instructions (Signed)
1 - You may have urinary urgency (bladder spasms) and bloody urine on / off with stent in place. This is normal.  2 - Call MD or go to ER for fever >102, severe pain / nausea / vomiting not relieved by medications, or acute change in medical status  3 - Remove tethered stent on Monday morning at home by pulling on string, blue-white plastic tubing, and discarding. Dr. Tresa Moore is in the office Monday if issues arise.

## 2015-06-11 NOTE — Op Note (Signed)
NAME:  Dillon, Rios NO.:  [id number]  MEDICAL RECORD NO.:  [id number]  LOCATION:  [facility]                         FACILITY:  [facility]  PHYSICIAN:  Alexis Frock, MD     DATE OF BIRTH:  [birth year]  DATE OF PROCEDURE:  06/11/2015                              OPERATIVE REPORT  DIAGNOSIS:  Left distal ureteral stone with refractory colic.  PROCEDURES: 1. Cystoscopy with left retrograde pyelogram and interpretation. 2. Left ureteroscopy with basketing of stone. 3. Insertion of left ureteral stent, 5 x 26 Polaris with tether.  ESTIMATED BLOOD LOSS:  Nil.  COMPLICATION:  None.  SPECIMEN:  Left distal ureteral stone for compositional analysis.  FINDINGS: 1. Impacted left distal ureteral stone with moderate proximal     hydroureteronephrosis. 2. Successful placement of left ureteral stent, proximal in the renal     pelvis and distal in the bladder.  INDICATION:  Dillon Rios is a 70 year old gentleman, who was found on workup of colicky left flank pain to have a left distal ureteral stone. This study was performed while he was at the [facility].  He was given a trial of medical therapy with tamsulosin and pain meds, but unfortunately has failed to pass his stone definitively.  He is planning further travel with taking his grandchildren to [place] next week and he adamantly wishes to be stone free prior and I agree.  He underwent interval study with ultrasound in the office, which revealed persistent significant left hydronephrosis suggesting in-situ stone. Options were discussed including ureteroscopy versus shockwave lithotripsy versus continued medical therapy.  We both agreed on left ureteroscopy.  Informed consent was signed and placed in the medical record.  PROCEDURE IN DETAIL:  The patient being Dillon Rios, was verified. Procedure being left ureteroscopic stone manipulation was confirmed. Procedure was carried out.  Time-out was performed.   Intravenous antibiotics were administered.  General LMA anesthesia was introduced. The patient was placed into a low lithotomy position and sterile field was created by prepping and draping the patient's penis, perineum, and proximal thighs using iodine x3.  Next, cystourethroscopy was performed using a 23-French rigid cystoscope with 30-degree offset lens. Inspection of the anterior and posterior urethra were unremarkable. Inspection of the urinary bladder revealed no diverticula, calcifications, papular lesions.  The left distal ureter was cannulated with a 6-French end-hole catheter and left retrograde pyelogram was obtained.  Left retrograde pyelogram demonstrated a single left ureter with single- system left kidney.  There was a filling defect in J-hooking of the distal ureter consistent with known stone.  There was moderate hydroureteronephrosis above this.  A 0.038 zip wire was advanced at the level of the upper pole, set aside as a safety wire.  Next, semi-rigid ureteroscopy was performed of the distal left ureter, alongside a separate Sensor working wire with an 8-French feeding tube in the urinary bladder for pressure release.  Semi-rigid ureteroscopy revealed a quite impacted, but small left distal ureteral stone.  This did appear amenable to simple basketing.  As such, it was grasped on its long axis with an Escape basket, removed in its entirety and set aside for compositional analysis.  Repeat ureteroscopy of distal half of the  left ureter revealed no additional calcifications.  Note that the patient had only this one calcification.  We had achieved the goals of procedure today being left-sided stone free.  Given the significant impaction of the left distal ureter with associated edema, it was felt that interval stenting would be warranted.  As such, a new 5 x 26 Polaris-type stent was placed over the remaining safety wire using fluoroscopic guidance. Good proximal and  distal deployment were noted.  Tether was left in place and fashioned to the dorsum of the penis and procedure was terminated.  The patient tolerated the procedure well.  There were no immediate periprocedural complications.  The patient was taken to the postanesthesia care unit in stable condition.          ______________________________ Alexis Frock, MD     TM/MEDQ  D:  06/11/2015  T:  06/11/2015  Job:  [id number]

## 2015-06-11 NOTE — Anesthesia Procedure Notes (Signed)
Procedure Name: LMA Insertion Performed by: Roby Donaway J Pre-anesthesia Checklist: Patient identified, Emergency Drugs available, Suction available, Patient being monitored and Timeout performed Patient Re-evaluated:Patient Re-evaluated prior to inductionOxygen Delivery Method: Circle system utilized Preoxygenation: Pre-oxygenation with 100% oxygen Intubation Type: IV induction Ventilation: Mask ventilation without difficulty LMA: LMA inserted LMA Size: 4.0 Number of attempts: 1 Placement Confirmation: positive ETCO2,  CO2 detector and breath sounds checked- equal and bilateral Tube secured with: Tape Dental Injury: Teeth and Oropharynx as per pre-operative assessment        

## 2015-06-11 NOTE — Progress Notes (Signed)
Patient has been having a productive cough of green sputum. Afebrile.  Diminished sounds right lung. Rhonchi left upper lobe. CXR will be done preop.

## 2015-06-11 NOTE — Transfer of Care (Signed)
Immediate Anesthesia Transfer of Care Note  Patient: Dillon Rios  Procedure(s) Performed: Procedure(s): CYSTOSCOPY WITH RETROGRADE PYELOGRAM, URETEROSCOPY AND BASKET STONE EXTRACTION STENT PLACEMENT (Left)  Patient Location: PACU  Anesthesia Type:General  Level of Consciousness: awake, alert  and oriented  Airway & Oxygen Therapy: Patient Spontanous Breathing and Patient connected to face mask oxygen  Post-op Assessment: Report given to RN and Post -op Vital signs reviewed and stable  Post vital signs: Reviewed and stable  Last Vitals:  Filed Vitals:   06/11/15 1045  BP: 163/70  Pulse: 72  Temp: 36.6 C  Resp: 16    Complications: No apparent anesthesia complications

## 2015-06-11 NOTE — H&P (Signed)
Dillon Rios is an 70 y.o. male.    Chief Complaint: Pre-OP Left Ureteroscopic Stone Manipulation  HPI:   1 - Recurrent Nephrolithiasis - two prior passage episodes, one in 1990, another in 2008. Ca-Ox composition. CT 2008 stone free. Denies interval colic. CT 2014 stone free.  05/2015 - CT at [facility] with 26mm left UVJ stone on eval colic and Cr 1.6 up from baseline 0.89. No additional stones. Given trial medical therapy but no obvious interval passage and f/u US 3/13 with persistent mod hydro.   PMH sig for CAD/stent/plavix (no baseline limitations), appy, hernia repair, melanoma  Today "Dillon Rios" is seen to proceed with left ureteroscopic stone manipulation. He is taking grandchildren to [place] next week and we both agree that definitive treatment of ureteral stone warranted prior. No interval fevers. Most recent UA without infectious parameters.   Past Medical History  Diagnosis Date  . Hyperlipidemia   . Carpal tunnel syndrome     Bilateral  . Kidney stones     Dr Luanne Bras  . BPH (benign prostatic hypertrophy)   . Skin cancer     right scalp Dr Wilhemina Bonito  . Coronary artery disease 10/26/2004    drug eluting stent placement to the mild LAD and RCA  2006/ PCI of LAD for restenosis 02/24/2006-Dr Turner  . Arthritis     Past Surgical History  Procedure Laterality Date  . Hernia repair  2003  . Coronary stent placement  2006 and 2007  . Tonsillectomy      age 66  . Appendectomy      age 69  . Melanoma removal      from left forearm-Dr [facility]  . Cardiac catheterization      Family History  Problem Relation Age of Onset  . CAD Mother   . Heart attack Father   . Breast cancer Sister    Social History:  reports that he quit smoking about 37 years ago. His smoking use included Cigarettes. He has a 10 pack-year smoking history. He has never used smokeless tobacco. He reports that he drinks alcohol. His drug history is not on file.  Allergies:  Allergies   Allergen Reactions  . Mistletoe [Viscum Album] Hives    No prescriptions prior to admission    No results found for this or any previous visit (from the past 21 hour(s)). No results found.  Review of Systems  Constitutional: Negative.  Negative for fever and chills.  HENT: Negative.   Eyes: Negative.   Respiratory: Negative.   Cardiovascular: Negative.   Gastrointestinal: Negative.  Negative for vomiting.  Genitourinary: Positive for flank pain.  Musculoskeletal: Negative.   Skin: Negative.   Neurological: Negative.   Endo/Heme/Allergies: Negative.   Psychiatric/Behavioral: Negative.     There were no vitals taken for this visit. Physical Exam  Constitutional: He appears well-developed.  HENT:  Head: Normocephalic.  Eyes: Pupils are equal, round, and reactive to light.  Neck: Normal range of motion.  Cardiovascular: Normal rate.   Respiratory: Effort normal.  GI: Soft.  Genitourinary:  Mid left CVAT  Musculoskeletal: Normal range of motion.  Neurological: He is alert.  Skin: Skin is warm.  Psychiatric: He has a normal mood and affect. His behavior is normal. Judgment and thought content normal.     Assessment/Plan   1 - Nephrolithiasis - We rediscussed ureteroscopic stone manipulation with basketing and laser-lithotripsy in detail.  We rediscussed risks including bleeding, infection, damage to kidney / ureter  bladder,  rarely loss of kidney. We rediscussed anesthetic risks and rare but serious surgical complications including DVT, PE, MI, and mortality. We specifically readdressed that in 5-10% of cases a staged approach is required with stenting followed by re-attempt ureteroscopy if anatomy unfavorable.   The patient voiced understanding and wishes to proceed today as planned.       Alexis Frock, MD 06/11/2015, 6:58 AM

## 2015-06-11 NOTE — Anesthesia Preprocedure Evaluation (Addendum)
Anesthesia Evaluation  Patient identified by MRN, date of birth, ID band Patient awake    Reviewed: Allergy & Precautions, NPO status , Patient's Chart, lab work & pertinent test results  Airway Mallampati: II  TM Distance: >3 FB Neck ROM: Full    Dental no notable dental hx.    Pulmonary neg pulmonary ROS, former smoker,    Pulmonary exam normal breath sounds clear to auscultation       Cardiovascular + CAD and + Cardiac Stents  negative cardio ROS Normal cardiovascular exam Rhythm:Regular Rate:Normal     Neuro/Psych negative neurological ROS  negative psych ROS   GI/Hepatic negative GI ROS, Neg liver ROS,   Endo/Other  negative endocrine ROS  Renal/GU negative Renal ROS  negative genitourinary   Musculoskeletal negative musculoskeletal ROS (+)   Abdominal   Peds negative pediatric ROS (+)  Hematology negative hematology ROS (+)   Anesthesia Other Findings   Reproductive/Obstetrics negative OB ROS                            Anesthesia Physical Anesthesia Plan  ASA: III  Anesthesia Plan: General   Post-op Pain Management:    Induction: Intravenous  Airway Management Planned: LMA  Additional Equipment:   Intra-op Plan:   Post-operative Plan: Extubation in OR  Informed Consent: I have reviewed the patients History and Physical, chart, labs and discussed the procedure including the risks, benefits and alternatives for the proposed anesthesia with the patient or authorized representative who has indicated his/her understanding and acceptance.   Dental advisory given  Plan Discussed with: CRNA and Surgeon  Anesthesia Plan Comments:         Anesthesia Quick Evaluation

## 2015-10-19 ENCOUNTER — Telehealth: Payer: Self-pay | Admitting: Cardiology

## 2015-10-19 ENCOUNTER — Encounter: Payer: Self-pay | Admitting: Cardiology

## 2015-10-19 NOTE — Telephone Encounter (Signed)
New message   Pt is having symptoms after having skin cancer procedure.    Pt c/o swelling: STAT is pt has developed SOB within 24 hours  1. How long have you been experiencing swelling? Past Tuesday (10/13/15)  2. Where is the swelling located? Left ankle and right wrist  3.  Are you currently taking a "fluid pill"? no  4.  Are you currently SOB? No, tightness in chest  Not feeling well today   5.  Have you traveled recently?no

## 2015-10-19 NOTE — Telephone Encounter (Signed)
Calling stating he has been having some swelling in his (R) hand off and on x 1 year.  Also is having some swelling in his (L) ankle.  Has been having pain in his lower back along belt line and is scheduled to see Dr. Louanne Skye on 8/10.  Denies SOB.  States had episode a few minutes before calling of tightness in chest but only lasted few seconds.  No c/o of CP now.  Also is having some nasal drainage and headache.  Advised to call Dr. Baldomero Lamy office regarding swelling in his hand and the nasal drainage.  If unable to see her then would go to urgent care since he may have sinus infection.   BP has been good at 132/66 HR 69. Has not had any recent LP check.  He states he stopped the Fish oil because his wife thought he was taking to much medication.  Advised since has not had any recent lipid check needs to go back to taking.  He is taking the Simvastatin 80 mg.  He thinks he had an anxiety attack and feels better after talking it through.  Feels comfortable with suggestions and will call back if doesn't feel better.  Is scheduled to see [name] 8/10.

## 2015-11-05 ENCOUNTER — Ambulatory Visit (INDEPENDENT_AMBULATORY_CARE_PROVIDER_SITE_OTHER): Payer: Medicare Other | Admitting: Cardiology

## 2015-11-05 ENCOUNTER — Encounter: Payer: Self-pay | Admitting: Cardiology

## 2015-11-05 VITALS — BP 154/86 | HR 73 | Ht 66.0 in | Wt 177.0 lb

## 2015-11-05 DIAGNOSIS — R011 Cardiac murmur, unspecified: Secondary | ICD-10-CM | POA: Diagnosis not present

## 2015-11-05 DIAGNOSIS — I2510 Atherosclerotic heart disease of native coronary artery without angina pectoris: Secondary | ICD-10-CM

## 2015-11-05 DIAGNOSIS — E782 Mixed hyperlipidemia: Secondary | ICD-10-CM

## 2015-11-05 DIAGNOSIS — IMO0001 Reserved for inherently not codable concepts without codable children: Secondary | ICD-10-CM | POA: Insufficient documentation

## 2015-11-05 DIAGNOSIS — R030 Elevated blood-pressure reading, without diagnosis of hypertension: Secondary | ICD-10-CM

## 2015-11-05 HISTORY — DX: Cardiac murmur, unspecified: R01.1

## 2015-11-05 MED ORDER — ATORVASTATIN CALCIUM 80 MG PO TABS: 80.0000 mg | ORAL_TABLET | Freq: Every day | ORAL | 3 refills | Status: DC

## 2015-11-05 NOTE — Progress Notes (Signed)
Cardiology Office Note    Date:  11/05/2015   ID:  RONDEL Rios, DOB 1946/02/21, MRN [id number]  PCP:  Cari Caraway, MD  Cardiologist:  Fransico Him, MD   Chief Complaint  Patient presents with  . Coronary Artery Disease  . Hyperlipidemia    History of Present Illness:  Dillon Rios is a 70 y.o. male with a history of ASCAD with PCI of LAD and RCA in 2006 and then again in LAD for restenosis in 2007, dyslipidemia who presents today for followup. He is doing well. He denies any SOB, DOE, dizziness, palpitations, claudication or syncope. Occasionally he will have some mild edema in his legs.  Past Medical History:  Diagnosis Date  . Arthritis   . BPH (benign prostatic hypertrophy)   . Carpal tunnel syndrome    Bilateral  . Coronary artery disease 10/26/2004   drug eluting stent placement to the mild LAD and RCA  2006/ PCI of LAD for restenosis 02/24/2006-Dr Draylen Lobue  . Heart murmur 11/05/2015  . Hyperlipidemia   . Kidney stones    Dr Luanne Bras  . Skin cancer    right scalp Dr Wilhemina Bonito    Past Surgical History:  Procedure Laterality Date  . APPENDECTOMY     age 89  . CARDIAC CATHETERIZATION    . CORONARY STENT PLACEMENT  2006 and 2007  . CYSTOSCOPY WITH RETROGRADE PYELOGRAM, URETEROSCOPY AND STENT PLACEMENT Left 06/11/2015   Procedure: CYSTOSCOPY WITH RETROGRADE PYELOGRAM, URETEROSCOPY AND BASKET STONE EXTRACTION STENT PLACEMENT;  Surgeon: Alexis Frock, MD;  Location: [facility];  Service: Urology;  Laterality: Left;  . HERNIA REPAIR  2003  . 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 TONSILLECTOMY     age 89    Current Medications: Outpatient Medications Prior to Visit  Medication Sig Dispense Refill  . aspirin EC 81 MG tablet Take 1 tablet (81 mg total) by mouth daily.    . clopidogrel (PLAVIX) 75 MG tablet Take 75 mg by mouth daily.    . Multiple Vitamin (MULTIVITAMIN) tablet Take 1 tablet by mouth daily.    . naproxen sodium  (ANAPROX) 220 MG tablet Take 440 mg by mouth daily as needed (pain).    . nitroGLYCERIN (NITROSTAT) 0.4 MG SL tablet Place 0.4 mg under the tongue every 5 (five) minutes as needed for chest pain.    . Omega-3 Fatty Acids (FISH OIL) 1000 MG CAPS Take 1 capsule by mouth 2 (two) times daily.     . simvastatin (ZOCOR) 80 MG tablet Take 80 mg by mouth daily.    Marland Kitchen acetaminophen (TYLENOL) 500 MG tablet Take 1,000 mg by mouth every 6 (six) hours as needed for moderate pain.    . cephALEXin (KEFLEX) 500 MG capsule Take 1 capsule (500 mg total) by mouth 2 (two) times daily. To prevent post-op infection with stent in place (Patient not taking: Reported on 11/05/2015) 8 capsule 0  . guaiFENesin (MUCINEX) 600 MG 12 hr tablet Take 600 mg by mouth daily as needed for cough or to loosen phlegm.    Marland Kitchen ketorolac (TORADOL) 10 MG tablet Take 10 mg by mouth every 6 (six) hours as needed for moderate pain.     . Misc Natural Products (TART CHERRY ADVANCED PO) Take 1 Dose by mouth daily.    . ondansetron (ZOFRAN-ODT) 4 MG disintegrating tablet Take 4 mg by mouth daily as needed for nausea or vomiting.     Marland Kitchen oxyCODONE-acetaminophen (PERCOCET/ROXICET)  5-325 MG tablet Take 1-2 tablets by mouth every 6 (six) hours as needed for moderate pain or severe pain. (Patient not taking: Reported on 11/05/2015) 30 tablet 0  . polyethylene glycol (MIRALAX / GLYCOLAX) packet Take 17 g by mouth daily as needed for mild constipation.    . senna-docusate (SENOKOT-S) 8.6-50 MG tablet Take 1 tablet by mouth 2 (two) times daily. While taking pain meds to prevent constipation (Patient not taking: Reported on 11/05/2015) 30 tablet 0  . tamsulosin (FLOMAX) 0.4 MG CAPS capsule Take 0.4 mg by mouth daily as needed (urinary pain).     No facility-administered medications prior to visit.      Allergies:   M[place]toe [viscum album]   Social History   Social History  . Marital status: Married    Spouse name: N/A  . Number of children: N/A  . Years  of education: N/A   Social History Main Topics  . Smoking status: Former Smoker    Packs/day: 1.00    Years: 10.00    Types: Cigarettes    Quit date: 03/28/1978  . Smokeless tobacco: Never Used  . Alcohol use Yes     Comment: rare  . Drug use: Unknown  . Sexual activity: Not Asked   Other Topics Concern  . None   Social History Narrative  . None     Family History:  The patient's family history includes Breast cancer in his sister; CAD in his mother; Heart attack in his father.   ROS:   Please see the history of present illness.    ROS All other systems reviewed and are negative.   PHYSICAL EXAM:   VS:  BP (!) 154/86   Pulse 73   Ht 5\' 6"  (1.676 m)   Wt 177 lb (80.3 kg)   BMI 28.57 kg/m    GEN: Well nourished, well developed, in no acute distress  HEENT: normal  Neck: no JVD, carotid bruits, or masses Cardiac: RRR; no rubs, or gallops,no edema.  Intact distal pulses bilaterally. 2/6 SM at RUSB to LLSB Respiratory:  clear to auscultation bilaterally, normal work of breathing GI: soft, nontender, nondistended, + BS MS: no deformity or atrophy  Skin: warm and dry, no rash Neuro:  Alert and Oriented x 3, Strength and sensation are intact Psych: euthymic mood, full affect  Wt Readings from Last 3 Encounters:  11/05/15 177 lb (80.3 kg)  06/11/15 174 lb (78.9 kg)  11/05/14 180 lb (81.6 kg)      Studies/Labs Reviewed:   EKG:  EKG shows NSR at 73bpm with no ST changes and LAFB ordered today.    Recent Labs: No results found for requested labs within last 8760 hours.   Lipid Panel    Component Value Date/Time   CHOL 138 09/13/2013 0743   TRIG 91.0 09/13/2013 0743   HDL 46.10 09/13/2013 0743   CHOLHDL 3 09/13/2013 0743   VLDL 18.2 09/13/2013 0743   LDLCALC 74 09/13/2013 0743    Additional studies/ records that were reviewed today include:  none    ASSESSMENT:    1. Atherosclerosis of native coronary artery of native heart without angina pectoris   2.  Mixed hyperlipidemia   3. Heart murmur      PLAN:  In order of problems listed above:  1. ASCAD s/p multiple PCIs with no angina. Continue ASA/Plavix and statin.   2. Hyperlipidemia - LDL goal < 70. Continue statin.  LDL at PCP office was 79.  He is on  Simvastatin 80mg  daily. I have told him to stop simvastatin and start Lipitor 80mg  daily.  Check FLP and ALT in 6 weeks. 3. Heart murmur - check 2D echo to assess 4. Elevated BP - most likely anxiety - BP readings at home are normal.     Medication Adjustments/Labs and Tests Ordered: Current medicines are reviewed at length with the patient today.  Concerns regarding medicines are outlined above.  Medication changes, Labs and Tests ordered today are listed in the Patient Instructions below.  There are no Patient Instructions on file for this visit.   Signed, Fransico Him, MD  11/05/2015 8:58 AM    [address], [place],   [address] Phone: [phone]; Fax: [phone]

## 2015-11-05 NOTE — Patient Instructions (Signed)
Medication Instructions:  1) STOP SIMVASTATIN 2) START LIPITOR 80 mg daily  Labwork: FASTING LABS IN 6 WEEKS (Lipids, LFTs)  Testing/Procedures: Your physician has requested that you have an echocardiogram. Echocardiography is a painless test that uses sound waves to create images of your heart. It provides your doctor with information about the size and shape of your heart and how well your heart's chambers and valves are working. This procedure takes approximately one hour. There are no restrictions for this procedure.  Follow-Up: Your physician wants you to follow-up in: 1 year with [name]. You will receive a reminder letter in the mail two months in advance. If you don't receive a letter, please call our office to schedule the follow-up appointment.   Any Other Special Instructions Will Be Listed Below (If Applicable).     If you need a refill on your cardiac medications before your next appointment, please call your pharmacy.

## 2015-12-02 ENCOUNTER — Encounter: Payer: Self-pay | Admitting: Cardiology

## 2015-12-15 ENCOUNTER — Other Ambulatory Visit: Payer: Self-pay | Admitting: Specialist

## 2015-12-15 DIAGNOSIS — M25562 Pain in left knee: Secondary | ICD-10-CM

## 2015-12-16 ENCOUNTER — Telehealth: Payer: Self-pay | Admitting: Cardiology

## 2015-12-16 NOTE — Telephone Encounter (Signed)
New message   Pt verbalized that he turned in a copy of his recent ECHO and he wants to know if he still needs the ECHO 12-17-15 or is the [facility] of the [facility] okay

## 2015-12-16 NOTE — Telephone Encounter (Signed)
Calling stating he had Echo at [place] and brought copy to the office.  He is scheduled for Echo tomorrow 9/21 and wants to know if needs to have it done.  Showed Echo to [name] who states he does not need to have Echo tomorrow.  Medical records will scan into results so she can sign off on the Echo.  Notified pt that the Echo was cancelled for 9/21.

## 2015-12-17 ENCOUNTER — Other Ambulatory Visit: Payer: Medicare Other

## 2015-12-17 ENCOUNTER — Other Ambulatory Visit (HOSPITAL_COMMUNITY): Payer: Medicare Other

## 2015-12-22 ENCOUNTER — Ambulatory Visit
Admission: RE | Admit: 2015-12-22 | Discharge: 2015-12-22 | Disposition: A | Discharge status: Home / Self Care | Payer: Medicare Other | Source: Ambulatory Visit | Attending: Specialist | Admitting: Specialist

## 2015-12-22 DIAGNOSIS — M25562 Pain in left knee: Secondary | ICD-10-CM

## 2015-12-23 ENCOUNTER — Telehealth: Payer: Self-pay | Admitting: Cardiology

## 2015-12-23 NOTE — Telephone Encounter (Signed)
New message  Pt states he was instructed to call RN back. Pt states he does not know what the call was for. Please call back to discuss

## 2015-12-23 NOTE — Telephone Encounter (Signed)
-----   Message from Sueanne Margarita, MD sent at 12/16/2015 12:29 PM EDT ----- Normal LVF with mild TR

## 2015-12-23 NOTE — Telephone Encounter (Signed)
Informed patient of results and verbal understanding expressed.  

## 2016-01-22 ENCOUNTER — Ambulatory Visit (INDEPENDENT_AMBULATORY_CARE_PROVIDER_SITE_OTHER): Payer: Self-pay | Admitting: Specialist

## 2016-02-22 ENCOUNTER — Ambulatory Visit (INDEPENDENT_AMBULATORY_CARE_PROVIDER_SITE_OTHER): Payer: Self-pay | Admitting: Specialist

## 2016-03-03 ENCOUNTER — Ambulatory Visit (INDEPENDENT_AMBULATORY_CARE_PROVIDER_SITE_OTHER): Payer: Medicare Other | Admitting: Specialist

## 2016-03-03 ENCOUNTER — Encounter (INDEPENDENT_AMBULATORY_CARE_PROVIDER_SITE_OTHER): Payer: Self-pay | Admitting: Specialist

## 2016-03-03 VITALS — BP 145/65 | HR 61 | Ht 66.0 in | Wt 173.0 lb

## 2016-03-03 DIAGNOSIS — M25562 Pain in left knee: Secondary | ICD-10-CM | POA: Diagnosis not present

## 2016-03-03 DIAGNOSIS — G8929 Other chronic pain: Secondary | ICD-10-CM

## 2016-03-03 NOTE — Progress Notes (Signed)
Office Visit Note   Patient: Dillon Rios           Date of Birth: [birth year]           MRN: [id number] Visit Date: 03/03/2016              Requested by: Cari Caraway, MD [place], [place] [address] PCP: Cari Caraway, MD   Assessment & Plan: Visit Diagnoses:  1. Chronic pain of left knee     Plan: This patient is doing well we'll have follow-up in the office in 3 months for recheck. If he begins to have increased pain or any mechanical symptoms she will call me and let me know and I will send him to Dr. Alphonzo Severance to discuss the benefits of outpatient arthroscopy.  Follow-Up Instructions: Return in about 3 months (around 06/01/2016).   Orders:  No orders of the defined types were placed in this encounter.  No orders of the defined types were placed in this encounter.     Procedures: No procedures performed   Clinical Data: No additional findings.   Subjective: Chief Complaint  Patient presents with  . Left Knee - Pain    Patient is retuning today to follow up on left knee. States he has started taking cherry tart and also the injection that was given at the last ov did a lot of good. Pain much better. Still have some trouble when first gets up after sitting for period of time. No swelling, no giving out or locking up. Doing good overall.    Review of Systems  Constitutional: Negative.   HENT: Negative.   Gastrointestinal: Negative.   Genitourinary: Negative.   Skin: Negative.   Psychiatric/Behavioral: Negative.      Objective: Vital Signs: BP (!) 145/65   Pulse 61   Ht 5\' 6"  (1.676 m)   Wt 173 lb (78.5 kg)   BMI 27.92 kg/m   Physical Exam  Constitutional: He is oriented to person, place, and time. He appears well-nourished. No distress.  HENT:  Head: Normocephalic and atraumatic.  Eyes: Pupils are equal, round, and reactive to light.  Pulmonary/Chest: No respiratory distress.  Abdominal: He exhibits no distension.    Neurological: He is alert and oriented to person, place, and time.    Ortho Exam Left knee has good range of motion. Gait is normal. Negative McMurray's. Joint line nontender. Ligaments are stable. Specialty Comments:  No specialty comments available.  Imaging: No results found.   PMFS History: Patient Active Problem List   Diagnosis Date Noted  . Heart murmur 11/05/2015  . Elevated BP 11/05/2015  . Coronary atherosclerosis of native coronary artery 09/09/2013  . Mixed hyperlipidemia 09/09/2013   Past Medical History:  Diagnosis Date  . Arthritis   . BPH (benign prostatic hypertrophy)   . Carpal tunnel syndrome    Bilateral  . Coronary artery disease 10/26/2004   drug eluting stent placement to the mild LAD and RCA  2006/ PCI of LAD for restenosis 02/24/2006-Dr Turner  . Heart murmur 11/05/2015  . Hyperlipidemia   . Kidney stones    Dr Luanne Bras  . Skin cancer    right scalp Dr Wilhemina Bonito    Family History  Problem Relation Age of Onset  . CAD Mother   . Heart attack Father   . Breast cancer Sister     Past Surgical History:  Procedure Laterality Date  . APPENDECTOMY     age 66  . CARDIAC  CATHETERIZATION    . CORONARY STENT PLACEMENT  2006 and 2007  . CYSTOSCOPY WITH RETROGRADE PYELOGRAM, URETEROSCOPY AND STENT PLACEMENT Left 06/11/2015   Procedure: CYSTOSCOPY WITH RETROGRADE PYELOGRAM, URETEROSCOPY AND BASKET STONE EXTRACTION STENT PLACEMENT;  Surgeon: Alexis Frock, MD;  Location: [facility];  Service: Urology;  Laterality: Left;  . HERNIA REPAIR  2003  . 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 TONSILLECTOMY     age 23   Social History   Occupational History  . Not on file.   Social History Main Topics  . Smoking status: Former Smoker    Packs/day: 1.00    Years: 10.00    Types: Cigarettes    Quit date: 03/28/1978  . Smokeless tobacco: Never Used  . Alcohol use Yes     Comment: rare  . Drug use: Unknown  . Sexual activity: Not  on file

## 2016-05-12 ENCOUNTER — Other Ambulatory Visit: Payer: Self-pay | Admitting: Gastroenterology

## 2016-06-02 ENCOUNTER — Ambulatory Visit (INDEPENDENT_AMBULATORY_CARE_PROVIDER_SITE_OTHER): Payer: Medicare Other | Admitting: Specialist

## 2016-06-09 ENCOUNTER — Ambulatory Visit (INDEPENDENT_AMBULATORY_CARE_PROVIDER_SITE_OTHER): Payer: Medicare Other | Admitting: Specialist

## 2016-06-29 ENCOUNTER — Encounter (HOSPITAL_COMMUNITY): Payer: Self-pay | Admitting: *Deleted

## 2016-07-04 ENCOUNTER — Ambulatory Visit (HOSPITAL_COMMUNITY): Payer: Medicare Other | Admitting: Registered Nurse

## 2016-07-04 ENCOUNTER — Encounter (HOSPITAL_COMMUNITY)
Admission: RE | Disposition: A | Discharge status: Home / Self Care | Payer: Self-pay | Source: Ambulatory Visit | Attending: Gastroenterology

## 2016-07-04 ENCOUNTER — Ambulatory Visit (HOSPITAL_COMMUNITY)
Admission: RE | Admit: 2016-07-04 | Discharge: 2016-07-04 | Disposition: A | Discharge status: Home / Self Care | Payer: Medicare Other | Source: Ambulatory Visit | Attending: Gastroenterology | Admitting: Gastroenterology

## 2016-07-04 DIAGNOSIS — I2510 Atherosclerotic heart disease of native coronary artery without angina pectoris: Secondary | ICD-10-CM | POA: Insufficient documentation

## 2016-07-04 DIAGNOSIS — Z87891 Personal history of nicotine dependence: Secondary | ICD-10-CM | POA: Diagnosis not present

## 2016-07-04 DIAGNOSIS — Z1211 Encounter for screening for malignant neoplasm of colon: Secondary | ICD-10-CM | POA: Diagnosis present

## 2016-07-04 DIAGNOSIS — E7800 Pure hypercholesterolemia, unspecified: Secondary | ICD-10-CM | POA: Diagnosis not present

## 2016-07-04 DIAGNOSIS — Z955 Presence of coronary angioplasty implant and graft: Secondary | ICD-10-CM | POA: Diagnosis not present

## 2016-07-04 DIAGNOSIS — G5603 Carpal tunnel syndrome, bilateral upper limbs: Secondary | ICD-10-CM | POA: Diagnosis not present

## 2016-07-04 DIAGNOSIS — N400 Enlarged prostate without lower urinary tract symptoms: Secondary | ICD-10-CM | POA: Insufficient documentation

## 2016-07-04 DIAGNOSIS — Z7902 Long term (current) use of antithrombotics/antiplatelets: Secondary | ICD-10-CM | POA: Diagnosis not present

## 2016-07-04 DIAGNOSIS — Z85820 Personal history of malignant melanoma of skin: Secondary | ICD-10-CM | POA: Diagnosis not present

## 2016-07-04 DIAGNOSIS — K5730 Diverticulosis of large intestine without perforation or abscess without bleeding: Secondary | ICD-10-CM | POA: Diagnosis not present

## 2016-07-04 DIAGNOSIS — Z87442 Personal history of urinary calculi: Secondary | ICD-10-CM | POA: Insufficient documentation

## 2016-07-04 DIAGNOSIS — Z7982 Long term (current) use of aspirin: Secondary | ICD-10-CM | POA: Insufficient documentation

## 2016-07-04 HISTORY — DX: Personal history of urinary calculi: Z87.442

## 2016-07-04 HISTORY — DX: Headache, unspecified: R51.9

## 2016-07-04 HISTORY — DX: Headache: R51

## 2016-07-04 HISTORY — PX: COLONOSCOPY WITH PROPOFOL: SHX5780

## 2016-07-04 HISTORY — DX: Abnormal electrocardiogram (ECG) (EKG): R94.31

## 2016-07-04 SURGERY — COLONOSCOPY WITH PROPOFOL
Anesthesia: Monitor Anesthesia Care

## 2016-07-04 MED ORDER — SODIUM CHLORIDE 0.9 % IV SOLN: INTRAVENOUS | Status: DC

## 2016-07-04 MED ORDER — LACTATED RINGERS IV SOLN
INTRAVENOUS | Status: DC | PRN
  Administered 2016-07-04: 08:00:00 via INTRAVENOUS

## 2016-07-04 MED ORDER — LIDOCAINE 2% (20 MG/ML) 5 ML SYRINGE
INTRAMUSCULAR | Status: AC
  Filled 2016-07-04: qty 5

## 2016-07-04 MED ORDER — PROPOFOL 10 MG/ML IV BOLUS
INTRAVENOUS | Status: DC | PRN
  Administered 2016-07-04: 20 mg via INTRAVENOUS
  Administered 2016-07-04: 30 mg via INTRAVENOUS

## 2016-07-04 MED ORDER — PROPOFOL 10 MG/ML IV BOLUS
INTRAVENOUS | Status: AC
  Filled 2016-07-04: qty 40

## 2016-07-04 MED ORDER — PROPOFOL 500 MG/50ML IV EMUL
INTRAVENOUS | Status: DC | PRN
  Administered 2016-07-04: 130 ug/kg/min via INTRAVENOUS

## 2016-07-04 SURGICAL SUPPLY — 22 items

## 2016-07-04 NOTE — Op Note (Signed)
[facility] Patient Name: Dillon Rios Procedure Date: 07/04/2016 MRN: [id number] Attending MD: Garlan Fair , MD Date of Birth: 09-10-1945 CSN: [id number] Age: 71 Admit Type: Outpatient Procedure:                Colonoscopy Indications:              Screening for colorectal malignant neoplasm Providers:                Garlan Fair, MD, Hilma Favors, RN, Corliss Parish, Technician Referring MD:              Medicines:                Propofol per Anesthesia Complications:            No immediate complications. Estimated Blood Loss:     Estimated blood loss: none. Procedure:                Pre-Anesthesia Assessment:                           - Prior to the procedure, a History and Physical                            was performed, and patient medications and                            allergies were reviewed. The patient's tolerance of                            previous anesthesia was also reviewed. The risks                            and benefits of the procedure and the sedation                            options and risks were discussed with the patient.                            All questions were answered, and informed consent                            was obtained. Prior Anticoagulants: The patient has                            taken Plavix (clopidogrel), last dose was 7 days                            prior to procedure. ASA Grade Assessment: II - A                            patient with mild systemic disease. After reviewing  the risks and benefits, the patient was deemed in                            satisfactory condition to undergo the procedure.                           After obtaining informed consent, the colonoscope                            was passed under direct vision. Throughout the                            procedure, the patient's blood pressure, pulse, and          oxygen saturations were monitored continuously. The                            was introduced through the anus and advanced to the                            the cecum, identified by appendiceal orifice and                            ileocecal valve. The colonoscopy was performed                            without difficulty. The patient tolerated the                            procedure well. The quality of the bowel                            preparation was good. The terminal ileum, the                            ileocecal valve, the appendiceal orifice and the                            rectum were photographed. Scope In: 8:40:39 AM Scope Out: 8:59:03 AM Scope Withdrawal Time: 0 hours 11 minutes 3 seconds  Total Procedure Duration: 0 hours 18 minutes 24 seconds  Findings:      The perianal and digital rectal examinations were normal.      The entire examined colon appeared normal. Left colonic diverticulosis       was present. Impression:               - The entire examined colon is normal.                           - No specimens collected. Moderate Sedation:      N/A- Per Anesthesia Care Recommendation:           - Patient has a contact number available for                            emergencies. The  signs and symptoms of potential                            delayed complications were discussed with the                            patient. Return to normal activities tomorrow.                            Written discharge instructions were provided to the                            patient.                           - Repeat colonoscopy is not recommended for                            screening purposes.                           - Resume previous diet.                           - Continue present medications. Procedure Code(s):        --- Professional ---                           Z6109, Colorectal cancer screening; colonoscopy on                            individual  not meeting criteria for high risk Diagnosis Code(s):        --- Professional ---                           Z12.11, Encounter for screening for malignant                            neoplasm of colon CPT copyright 2016 [employer] Association. All rights reserved. The codes documented in this report are preliminary and upon coder review may  be revised to meet current compliance requirements. Earle Gell, MD Garlan Fair, MD 07/04/2016 9:05:10 AM This report has been signed electronically. Number of Addenda: 0

## 2016-07-04 NOTE — Anesthesia Postprocedure Evaluation (Signed)
Anesthesia Post Note  Patient: Dillon Rios.  Procedure(s) Performed: Procedure(s) (LRB): COLONOSCOPY WITH PROPOFOL (N/A)  Patient location during evaluation: PACU Anesthesia Type: MAC Level of consciousness: awake and alert Pain management: pain level controlled Vital Signs Assessment: post-procedure vital signs reviewed and stable Respiratory status: spontaneous breathing, nonlabored ventilation, respiratory function stable and patient connected to nasal cannula oxygen Cardiovascular status: stable and blood pressure returned to baseline Anesthetic complications: no       Last Vitals:  Vitals:   07/04/16 0919 07/04/16 0920  BP: 137/80 (!) 145/79  Pulse: 65 64  Resp: 18 13  Temp:      Last Pain:  Vitals:   07/04/16 0905  TempSrc: Oral                 [name] DAVID

## 2016-07-04 NOTE — Transfer of Care (Signed)
Immediate Anesthesia Transfer of Care Note  Patient: Dillon Rios.  Procedure(s) Performed: Procedure(s): COLONOSCOPY WITH PROPOFOL (N/A)  Patient Location: PACU and Endoscopy Unit  Anesthesia Type:MAC  Level of Consciousness: awake, alert , oriented and patient cooperative  Airway & Oxygen Therapy: Patient Spontanous Breathing and Patient connected to face mask oxygen  Post-op Assessment: Report given to RN, Post -op Vital signs reviewed and stable and Patient moving all extremities  Post vital signs: Reviewed and stable  Last Vitals:  Vitals:   07/04/16 0800  BP: (!) 160/66  Pulse: 70  Resp: 12  Temp: 36.7 C    Last Pain:  Vitals:   07/04/16 0800  TempSrc: Oral         Complications: No apparent anesthesia complications

## 2016-07-04 NOTE — Anesthesia Preprocedure Evaluation (Addendum)
Anesthesia Evaluation  Patient identified by MRN, date of birth, ID band Patient awake    Reviewed: Allergy & Precautions, NPO status , Patient's Chart, lab work & pertinent test results  Airway Mallampati: I  TM Distance: >3 FB Neck ROM: Full    Dental   Pulmonary former smoker,    Pulmonary exam normal        Cardiovascular + CAD and + Cardiac Stents  Normal cardiovascular exam     Neuro/Psych    GI/Hepatic   Endo/Other    Renal/GU      Musculoskeletal   Abdominal   Peds  Hematology   Anesthesia Other Findings   Reproductive/Obstetrics                             Anesthesia Physical Anesthesia Plan  ASA: III  Anesthesia Plan: MAC   Post-op Pain Management:    Induction: Intravenous  Airway Management Planned: Simple Face Mask  Additional Equipment:   Intra-op Plan:   Post-operative Plan:   Informed Consent: I have reviewed the patients History and Physical, chart, labs and discussed the procedure including the risks, benefits and alternatives for the proposed anesthesia with the patient or authorized representative who has indicated his/her understanding and acceptance.     Plan Discussed with: CRNA and Surgeon  Anesthesia Plan Comments:         Anesthesia Quick Evaluation

## 2016-07-04 NOTE — Discharge Instructions (Signed)

## 2016-07-04 NOTE — H&P (Signed)
Procedure: Screening colonoscopy. Normal screening colonoscopy was performed on 09/01/2005  History: The patient is a 71 year old male born [birth year]. He is scheduled to undergo a repeat screening colonoscopy today. He chronically takes aspirin and Plavix. He was instructed to stop taking Plavix one week prior to his colonoscopy but continue taking aspirin.  Past medical history: Hypercholesterolemia. Bilateral carpal tunnel syndrome. Coronary artery disease. Drug including coronary artery stent placement. Kidney stones. Benign prostatic hypertrophy. Allergic rhinitis. Right rotator cuff surgery. Tonsillectomy. Appendectomy. Melanoma surgery from the left forearm. Herniorrhaphy.  Exam: The patient is alert and lying comfortably on the endoscopy stretcher. Abdomen is soft and nontender to palpation. Lungs are clear to auscultation. Cardiac exam reveals a regular rhythm.  Plan: Proceed with screening colonoscopy.

## 2016-07-05 ENCOUNTER — Encounter (HOSPITAL_COMMUNITY): Payer: Self-pay | Admitting: Gastroenterology

## 2016-07-25 ENCOUNTER — Ambulatory Visit (INDEPENDENT_AMBULATORY_CARE_PROVIDER_SITE_OTHER): Payer: Medicare Other | Admitting: Specialist

## 2016-07-25 ENCOUNTER — Encounter (INDEPENDENT_AMBULATORY_CARE_PROVIDER_SITE_OTHER): Payer: Self-pay | Admitting: Specialist

## 2016-07-25 VITALS — BP 147/73 | Ht 66.0 in | Wt 173.0 lb

## 2016-07-25 DIAGNOSIS — M7672 Peroneal tendinitis, left leg: Secondary | ICD-10-CM | POA: Diagnosis not present

## 2016-07-25 DIAGNOSIS — M6702 Short Achilles tendon (acquired), left ankle: Secondary | ICD-10-CM | POA: Diagnosis not present

## 2016-07-25 DIAGNOSIS — M6701 Short Achilles tendon (acquired), right ankle: Secondary | ICD-10-CM | POA: Diagnosis not present

## 2016-07-25 NOTE — Progress Notes (Signed)
Office Visit Note   Patient: Dillon Rios.           Date of Birth: 1946-01-16           MRN: [id number] Visit Date: 07/25/2016              Requested by: Cari Caraway, MD [place], [place] [address] PCP: Cari Caraway, MD   Assessment & Plan: Visit Diagnoses:  1. Peroneal tendonitis of left lower leg   2. Contracture of both Achilles tendons     Plan: Your exam and history are consistent with left foot peroneal tendonitis, it is possible that there may be arthritis due to extra stress or gout. The injection you had suggests that it was not a stress fracture but still could represent an inflamed joint or tendon.   Your heel cord is also tight. Work on stretching exercises. Google peroneal tendonitis exercises and start. Use voltaren gel 2 grams rubbed into the area of the outer mid foot up to 3-4 x a day. Ice for pain 10-15 min 2-3 times per day. Use a stiffer shoe. Watch for wear on the outer part of the back of the heel of the shoe. Weight loss helps.  Ask your Urologist for a 24 hour testing for gout. Dr. Tresa Moore.    Follow-Up Instructions: No Follow-up on file.   Orders:  No orders of the defined types were placed in this encounter.  No orders of the defined types were placed in this encounter.     Procedures: No procedures performed   Clinical Data: No additional findings.   Subjective: Chief Complaint  Patient presents with  . Left Knee - Follow-up    71 year old male with previous left knee pain post left knee injection with steroid in  11/2015 with good improvement in his pain and left knee swelling. Only one episode of left knee pain which improved spontaneously. Had one episode of pain into the left foot and was seen by the foot doctor and recieved an injection of steroid into the left lateral foot. He was limping severely at the time and today he presents to allow [place] to see and evaluate his left foot. Has had a history of kidney  stones and has had recent colonoscopy that was normal. The most recent kidney stone was a urate stone, 2 years ago. Weight is decreased he notes that wife was diagnosed with diabetes and is on insulin.  She is on the waiting list for [facility].     Review of Systems  Constitutional: Negative.   HENT: Negative.   Eyes: Negative.   Respiratory: Negative.   Cardiovascular: Negative.   Gastrointestinal: Negative.   Endocrine: Negative.   Genitourinary: Negative.   Musculoskeletal: Negative.   Skin: Negative.   Allergic/Immunologic: Negative.   Neurological: Negative.   Hematological: Negative.   Psychiatric/Behavioral: Negative.      Objective: Vital Signs: BP (!) 147/73 (BP Location: Left Arm, Patient Position: Sitting)   Ht 5\' 6"  (1.676 m)   Wt 173 lb (78.5 kg)   BMI 27.92 kg/m   Physical Exam  Constitutional: He is oriented to person, place, and time. He appears well-developed and well-nourished.  HENT:  Head: Normocephalic and atraumatic.  Eyes: EOM are normal. Pupils are equal, round, and reactive to light.  Neck: Normal range of motion. Neck supple.  Pulmonary/Chest: Effort normal and breath sounds normal.  Abdominal: Soft. Bowel sounds are normal.  Musculoskeletal: Normal range of motion.  Neurological: He is alert and oriented to person, place, and time.  Skin: Skin is warm and dry.  Psychiatric: He has a normal mood and affect. His behavior is normal. Judgment and thought content normal.    Right Ankle Exam   Range of Motion  Dorsiflexion: 0  Plantar flexion: 35  Inversion: normal  Eversion: normal   Muscle Strength  Dorsiflexion:  5/5 Plantar flexion:  5/5 Anterior tibial:  5/5 Posterior tibial:  5/5 Gastrocsoleus:  5/5 Peroneal muscle:  5/5 Other  Scars: absent Sensation: normal Pulse: present    Left Ankle Exam   Range of Motion  Dorsiflexion: 0  Plantar flexion: 35  Inversion: normal  Eversion: normal   Muscle Strength    Dorsiflexion:  5/5  Plantar flexion:  5/5  Anterior tibial:  5/5  Posterior tibial:  5/5 Gastrocsoleus:  5/5 Peroneal muscle:  5/5  Other  Scars: absent Sensation: normal Pulse: present      Specialty Comments:  No specialty comments available.  Imaging: No results found.   PMFS History: Patient Active Problem List   Diagnosis Date Noted  . Heart murmur 11/05/2015  . Elevated BP 11/05/2015  . Coronary atherosclerosis of native coronary artery 09/09/2013  . Mixed hyperlipidemia 09/09/2013   Past Medical History:  Diagnosis Date  . Abnormal EKG    left anterio fasicula rblock on 11-05-15 ekg and 10-28-14 ekg epic  . Arthritis   . Carpal tunnel syndrome    Bilateral  . Coronary artery disease 10/26/2004   drug eluting stent placement to the mild LAD and RCA  2006/ PCI of LAD for restenosis 02/24/2006-Dr Turner  . Headache   . Heart murmur 11/05/2015  . History of kidney stones   . Hyperlipidemia   . Skin cancer    right scalp Dr Wilhemina Bonito    Family History  Problem Relation Age of Onset  . CAD Mother   . Heart attack Father   . Breast cancer Sister     Past Surgical History:  Procedure Laterality Date  . APPENDECTOMY     age 75  . CARDIAC CATHETERIZATION    . COLONOSCOPY WITH PROPOFOL N/A 07/04/2016   Procedure: COLONOSCOPY WITH PROPOFOL;  Surgeon: Garlan Fair, MD;  Location: [facility];  Service: Endoscopy;  Laterality: N/A;  . CORONARY STENT PLACEMENT  2006 and 2007  . CYSTOSCOPY WITH RETROGRADE PYELOGRAM, URETEROSCOPY AND STENT PLACEMENT Left 06/11/2015   Procedure: CYSTOSCOPY WITH RETROGRADE PYELOGRAM, URETEROSCOPY AND BASKET STONE EXTRACTION STENT PLACEMENT;  Surgeon: Alexis Frock, MD;  Location: [facility];  Service: Urology;  Laterality: Left;  . HERNIA REPAIR  2003  . melanoma removal     from left forearm-Dr [facility]  . TONSILLECTOMY     age 45   Social History   Occupational History  . Not on file.   Social History Main Topics  .  Smoking status: Former Smoker    Packs/day: 1.00    Years: 10.00    Types: Cigarettes    Quit date: 03/28/1978  . Smokeless tobacco: Never Used  . Alcohol use Yes     Comment: rare  . Drug use: No  . Sexual activity: Not on file

## 2016-07-25 NOTE — Patient Instructions (Addendum)
Your exam and history are consistent with left foot peroneal tendonitis, it is possible that there may be arthritis due to extra stress or gout. The injection you had suggests that it was not a stress fracture but still could represent an inflamed joint or tendon.   Your heel cord is also tight. Work on stretching exercises. Google peroneal tendonitis exercises and start. Use voltaren gel 2 grams rubbed into the area of the outer mid foot up to 3-4 x a day. Ice for pain 10-15 min 2-3 times per day. Use a stiffer shoe. Watch for wear on the outer part of the back of the heel of the shoe. Weight loss helps. Ask your Urologist for a 24 hour testing for gout. Dr. Tresa Moore.

## 2016-10-17 IMAGING — MR MR KNEE*L* W/O CM
4 of 5 series · 19 of 40 positions shown · non-contrast
Comparison: None.

CLINICAL DATA: Left knee pain.

EXAM:
MRI OF THE LEFT KNEE WITHOUT CONTRAST
TECHNIQUE: Multiplanar, multisequence MR imaging of the knee was performed. No
intravenous contrast was administered.

[Series 3: PD fat-sat · axial · 3.5mm · 0.31mm/px · z∈[-71,+29]mm · 7 of 25 slices shown (1 of 3)]
[im 1/25]
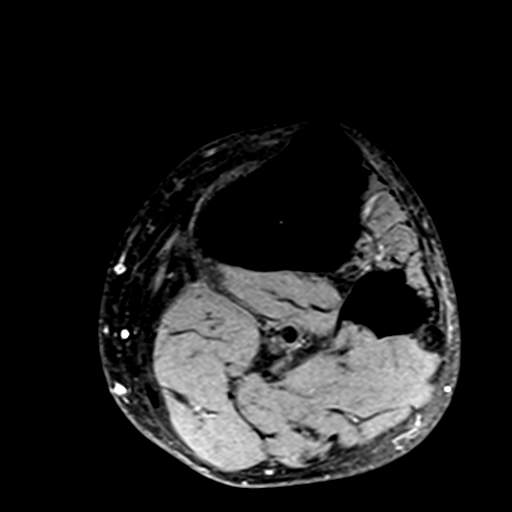
[im 5/25]
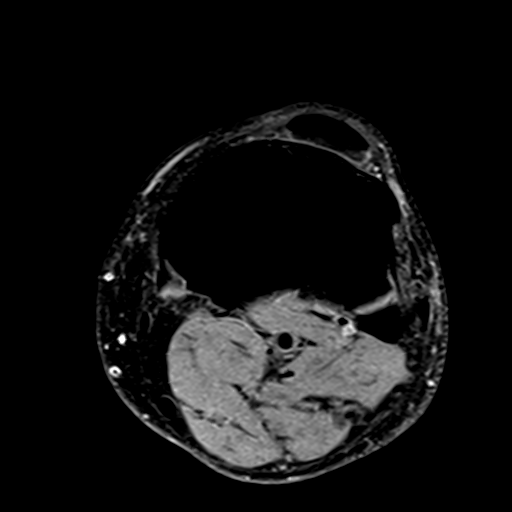
[im 9/25]
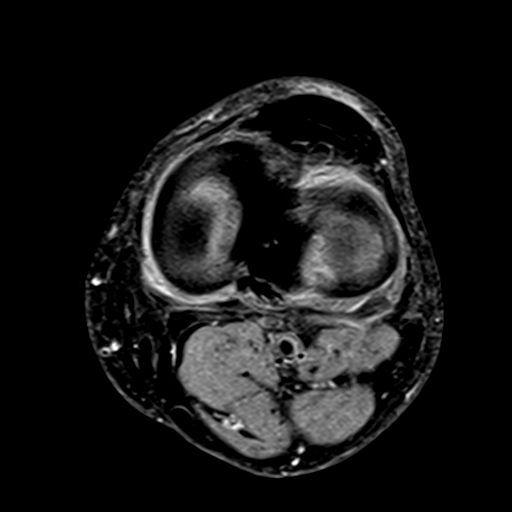
[im 13/25]
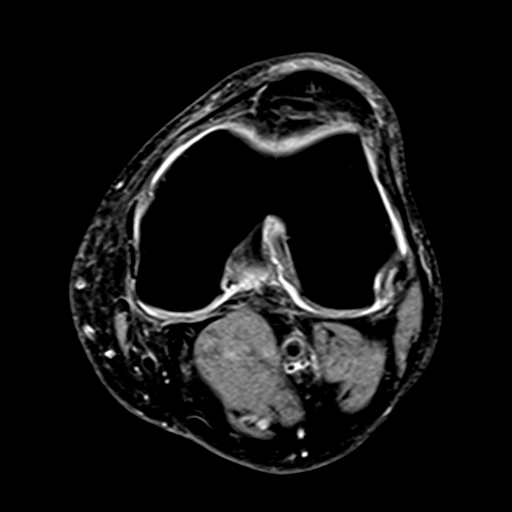
[im 17/25]
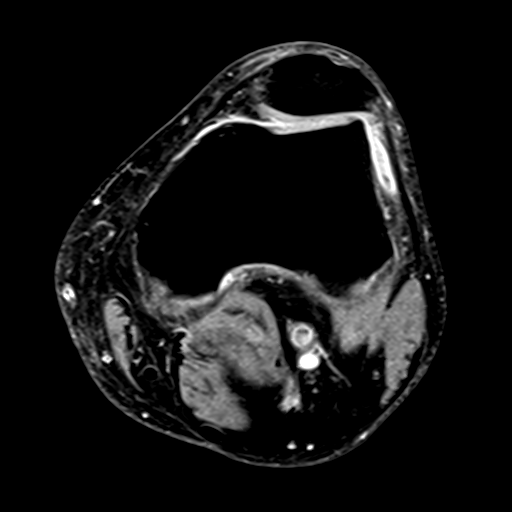
[im 21/25]
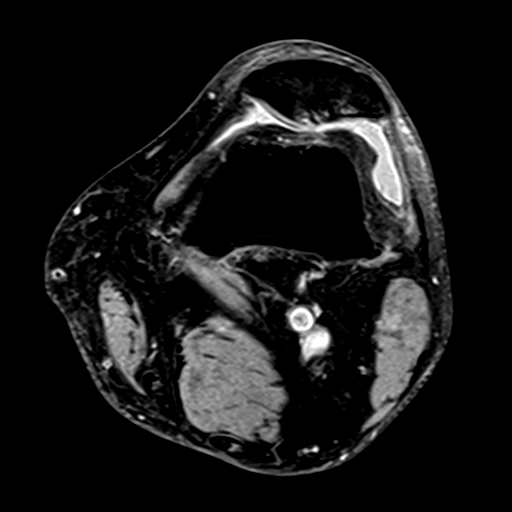
[im 25/25]
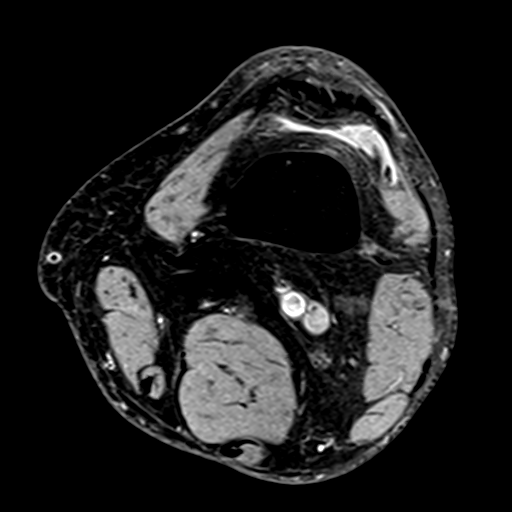

[Series 4: PD fat-sat · coronal · 3.5mm · 0.29mm/px · 6 of 25 slices shown (2 of 3)]
[im 1/25]
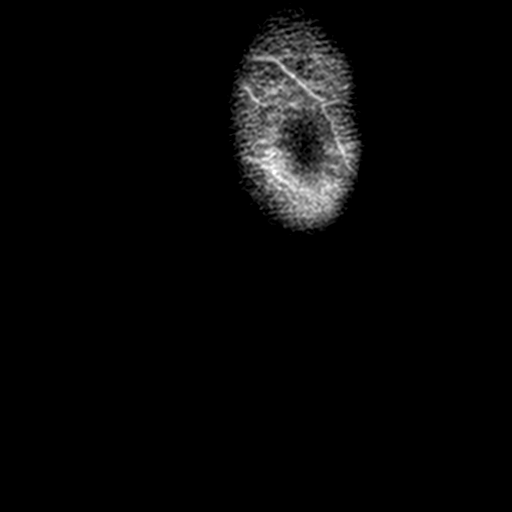
[im 4/25]
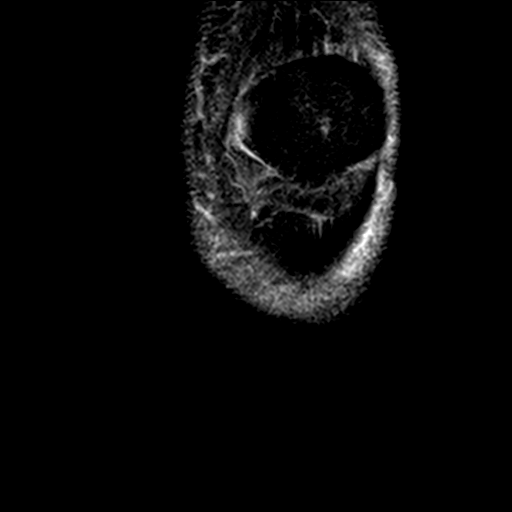
[im 7/25]
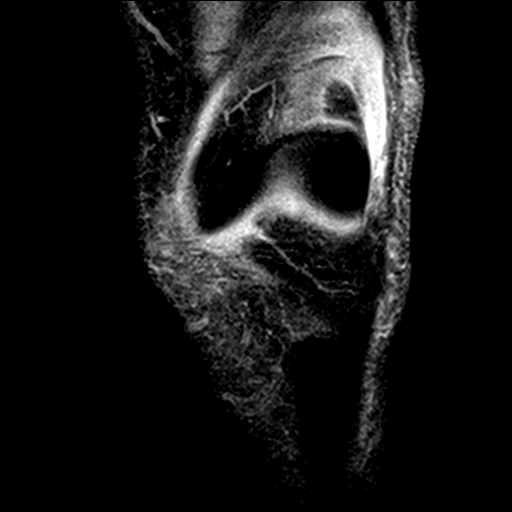
[im 11/25]
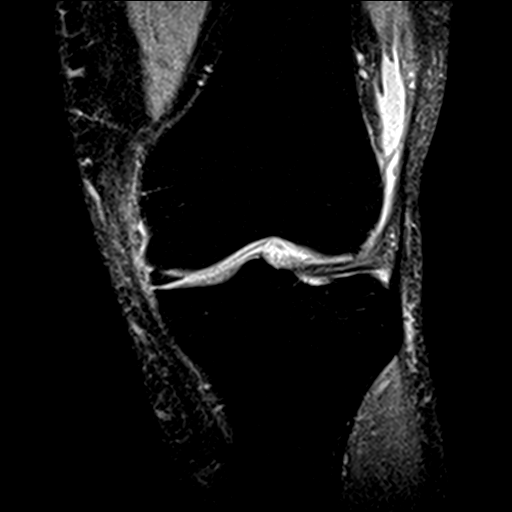
[im 14/25]
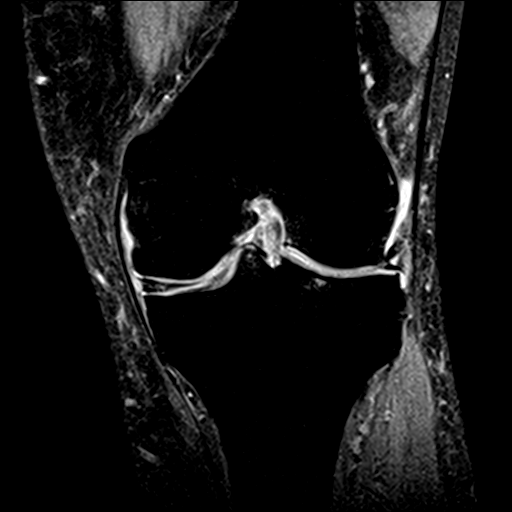
[im 21/25]
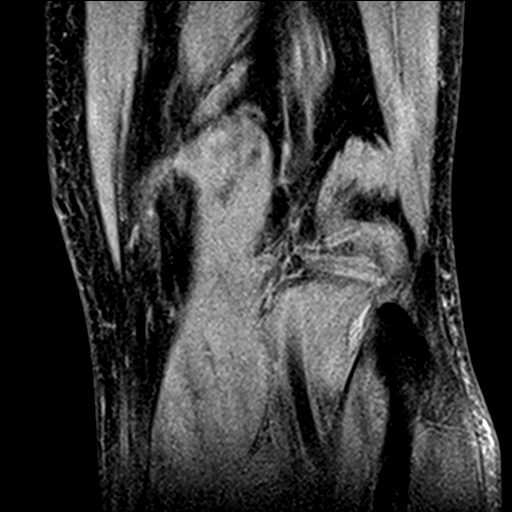

[Series 5: T2 fat-sat · coronal · 3.5mm · 0.29mm/px · 3 of 25 slices shown]
[im 4/25]
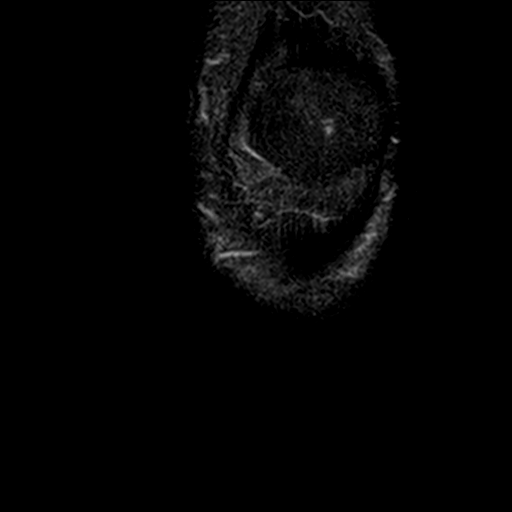
[im 14/25]
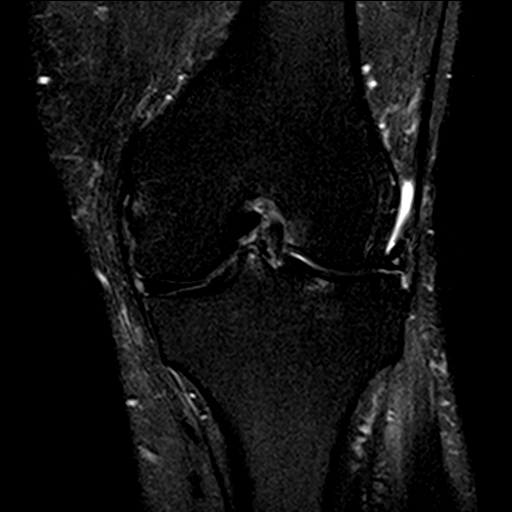
[im 21/25]
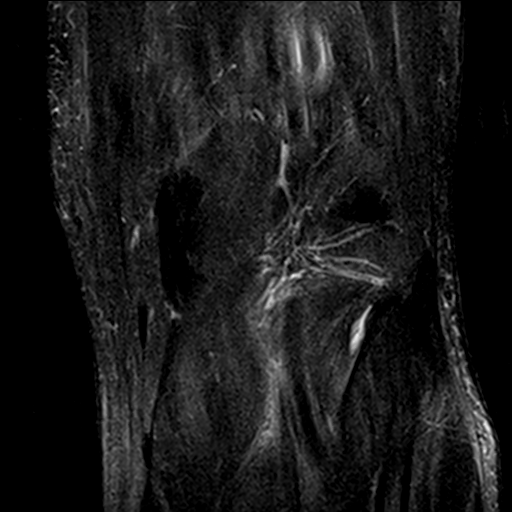

[Series 7: PD fat-sat · sagittal · 3.2mm · 0.29mm/px · 3 of 29 slices shown (3 of 3)]
[im 4/29]
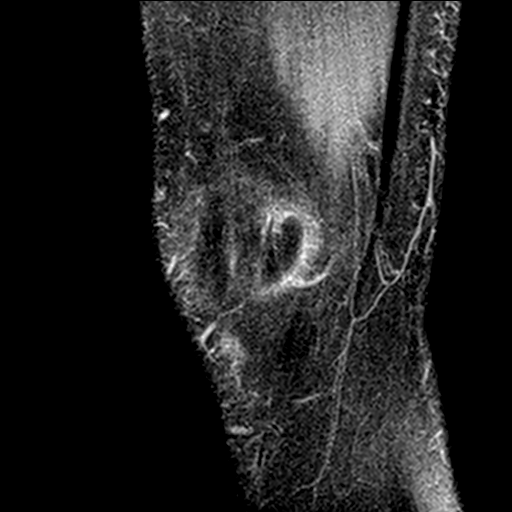
[im 15/29]
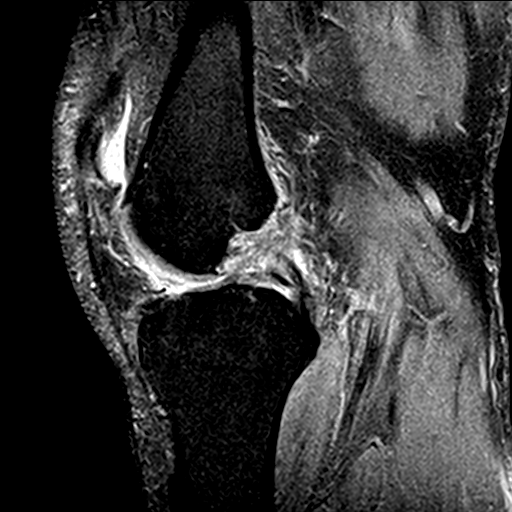
[im 25/29]
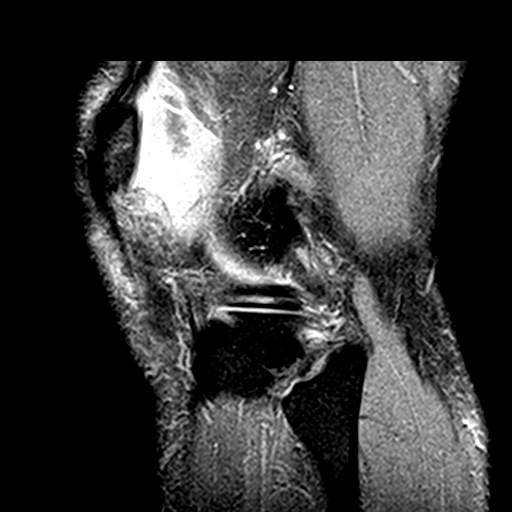

[19 of 40 positions shown; findings below may reference images not displayed]

FINDINGS: MENISCI

Medial meniscus: Increased signal in the posterior horn of the
medial meniscus consistent with degeneration. No discrete tear.

Lateral meniscus: Radial tear of the free edge of the body of the
lateral meniscus. Partial radial tear of the posterior horn of the
lateral meniscus adjacent to the meniscal root.

LIGAMENTS

Cruciates:  Intact ACL and PCL.

Collaterals: Medial collateral ligament is intact. Lateral
collateral ligament complex is intact.

CARTILAGE

Patellofemoral: Full-thickness cartilage loss of the lateral
patellar facet with subchondral reactive marrow changes.
Partial-thickness cartilage loss of the medial patellar facet.

Medial:  No chondral defect.

Lateral: Partial-thickness cartilage loss with areas of high-grade
partial-thickness cartilage loss of the lateral femoral condyle and
lateral tibial plateau with subchondral reactive marrow changes in
the lateral tibial plateau.

Joint: Small joint effusion. Normal Hoffa's fat. No plical
thickening.

Popliteal Fossa:  No Baker cyst.  Intact popliteus tendon.

Extensor Mechanism:  Intact quadriceps tendon and patellar tendon.

Bones: No other marrow signal abnormality. No fracture or
dislocation.

Other: No fluid collection or hematoma.
IMPRESSION: 1. Radial tear of the free edge of the body of the lateral meniscus.
Partial radial tear of the posterior horn of the lateral meniscus
adjacent to the meniscal root.
2. Cartilage abnormalities of the patellofemoral compartment and
lateral femorotibial compartment as described above.

## 2016-10-31 ENCOUNTER — Ambulatory Visit (INDEPENDENT_AMBULATORY_CARE_PROVIDER_SITE_OTHER): Payer: Medicare Other | Admitting: Cardiology

## 2016-10-31 ENCOUNTER — Other Ambulatory Visit: Payer: Self-pay | Admitting: Cardiology

## 2016-10-31 ENCOUNTER — Encounter (INDEPENDENT_AMBULATORY_CARE_PROVIDER_SITE_OTHER): Payer: Self-pay

## 2016-10-31 ENCOUNTER — Encounter: Payer: Self-pay | Admitting: Cardiology

## 2016-10-31 VITALS — BP 140/74 | HR 68 | Ht 66.0 in | Wt 165.4 lb

## 2016-10-31 DIAGNOSIS — E782 Mixed hyperlipidemia: Secondary | ICD-10-CM

## 2016-10-31 DIAGNOSIS — I2510 Atherosclerotic heart disease of native coronary artery without angina pectoris: Secondary | ICD-10-CM

## 2016-10-31 LAB — HEPATIC FUNCTION PANEL
ALT: 13 IU/L (ref 0–44)
AST: 19 IU/L (ref 0–40)
Albumin: 4.3 g/dL (ref 3.5–4.8)
Alkaline Phosphatase: 111 IU/L (ref 39–117)
Bilirubin Total: 0.6 mg/dL (ref 0.0–1.2)
Bilirubin, Direct: 0.16 mg/dL (ref 0.00–0.40)
TOTAL PROTEIN: 6.7 g/dL (ref 6.0–8.5)

## 2016-10-31 LAB — LIPID PANEL
CHOLESTEROL TOTAL: 136 mg/dL (ref 100–199)
Chol/HDL Ratio: 2.6 ratio (ref 0.0–5.0)
HDL: 53 mg/dL (ref >39)
LDL CALC: 70 mg/dL (ref 0–99)
TRIGLYCERIDES: 63 mg/dL (ref 0–149)
VLDL CHOLESTEROL CAL: 13 mg/dL (ref 5–40)

## 2016-10-31 NOTE — Progress Notes (Signed)
Cardiology Office Note:    Date:  10/31/2016   ID:  Dillon Nations., DOB 1946/03/21, MRN [id number]  PCP:  Cari Caraway, MD  Cardiologist:  Fransico Him, MD   Referring MD: Cari Caraway, MD   Chief Complaint  Patient presents with  . Follow-up    1 year  . Coronary Artery Disease  . Hyperlipidemia    History of Present Illness:    Dillon Rios. is a 71 y.o. male with a hx of ASCAD with PCI of LAD and RCA in 2006 and then again for LAD restenosis in 2007  He also has a history of dyslipidemia He presents today for followup and  is doing well. He denies any SOB, DOE,chest pain or pressure,  dizziness, palpitations, claudication or syncope. Occasionally he has some left knee and left ankle swelling.   Past Medical History:  Diagnosis Date  . Abnormal EKG    left anterio fasicula rblock on 11-05-15 ekg and 10-28-14 ekg epic  . Arthritis   . Carpal tunnel syndrome    Bilateral  . Coronary artery disease 10/26/2004   drug eluting stent placement to the mild LAD and RCA  2006/ PCI of LAD for restenosis 02/24/2006-Dr Shiara Mcgough  . Headache   . Heart murmur 11/05/2015  . History of kidney stones   . Hyperlipidemia   . Skin cancer    right scalp Dr Wilhemina Bonito    Past Surgical History:  Procedure Laterality Date  . APPENDECTOMY     age 37  . CARDIAC CATHETERIZATION    . COLONOSCOPY WITH PROPOFOL N/A 07/04/2016   Procedure: COLONOSCOPY WITH PROPOFOL;  Surgeon: Garlan Fair, MD;  Location: [facility];  Service: Endoscopy;  Laterality: N/A;  . CORONARY STENT PLACEMENT  2006 and 2007  . CYSTOSCOPY WITH RETROGRADE PYELOGRAM, URETEROSCOPY AND STENT PLACEMENT Left 06/11/2015   Procedure: CYSTOSCOPY WITH RETROGRADE PYELOGRAM, URETEROSCOPY AND BASKET STONE EXTRACTION STENT PLACEMENT;  Surgeon: Alexis Frock, MD;  Location: [facility];  Service: Urology;  Laterality: Left;  . HERNIA REPAIR  2003  . melanoma removal     from left forearm-Dr [facility]  . TONSILLECTOMY     age 32     Current Medications: Current Meds  Medication Sig  . aspirin EC 81 MG tablet Take 1 tablet (81 mg total) by mouth daily.  Marland Kitchen atorvastatin (LIPITOR) 80 MG tablet Take 80 mg by mouth daily.  . clopidogrel (PLAVIX) 75 MG tablet Take 75 mg by mouth daily.  . diclofenac sodium (VOLTAREN) 1 % GEL Apply 2 g topically daily as needed (pain).   . Liniments (BLUE-EMU SUPER STRENGTH) CREA Apply 1 application topically daily as needed (arthritis).  . Misc Natural Products (TART CHERRY ADVANCED) CAPS Take 2 capsules by mouth daily.  . Multiple Vitamin (MULTIVITAMIN) tablet Take 1 tablet by mouth daily.  . naproxen sodium (ANAPROX) 220 MG tablet Take 440 mg by mouth daily as needed (pain).  . nitroGLYCERIN (NITROSTAT) 0.4 MG SL tablet Place 0.4 mg under the tongue every 5 (five) minutes as needed for chest pain.  . Omega-3 Fatty Acids (FISH OIL) 1200 MG CAPS Take 1,200 mg by mouth 2 (two) times daily.  . Triamcinolone Acetonide (NASACORT AQ NA) Place 1 spray into the nose daily as needed (congestion).     Allergies:   Mistletoe [viscum album]   Social History   Social History  . Marital status: Married    Spouse name: N/A  . Number of children: N/A  .  Years of education: N/A   Social History Main Topics  . Smoking status: Former Smoker    Packs/day: 1.00    Years: 10.00    Types: Cigarettes    Quit date: 03/28/1978  . Smokeless tobacco: Never Used  . Alcohol use Yes     Comment: rare  . Drug use: No  . Sexual activity: Not Asked   Other Topics Concern  . None   Social History Narrative  . None     Family History: The patient's  family history includes Breast cancer in his sister; CAD in his mother; Heart attack in his father.  ROS:   Please see the history of present illness.      All other systems reviewed and are negative.  EKGs/Labs/Other Studies Reviewed:    The following studies were reviewed today:none  EKG:  EKG is  ordered today.  The ekg ordered today  demonstrates NSR at 68bpm with LAFB with no ST changes  Recent Labs: No results found for requested labs within last 8760 hours.   Recent Lipid Panel    Component Value Date/Time   CHOL 138 09/13/2013 0743   TRIG 91.0 09/13/2013 0743   HDL 46.10 09/13/2013 0743   CHOLHDL 3 09/13/2013 0743   VLDL 18.2 09/13/2013 0743   LDLCALC 74 09/13/2013 0743    Physical Exam:    VS:  BP 140/74   Pulse 68   Ht 5\' 6"  (1.676 m)   Wt 165 lb 6.4 oz (75 kg)   BMI 26.70 kg/m     Wt Readings from Last 3 Encounters:  10/31/16 165 lb 6.4 oz (75 kg)  07/25/16 173 lb (78.5 kg)  07/04/16 173 lb (78.5 kg)     GEN:  Well nourished, well developed in no acute distress HEENT: Normal NECK: No JVD; No carotid bruits LYMPHATICS: No lymphadenopathy CARDIAC: RRR, no murmurs, rubs, gallops RESPIRATORY:  Clear to auscultation without rales, wheezing or rhonchi  ABDOMEN: Soft, non-tender, non-distended MUSCULOSKELETAL:  No edema; No deformity  SKIN: Warm and dry NEUROLOGIC:  Alert and oriented x 3 PSYCHIATRIC:  Normal affect   ASSESSMENT:    1. Atherosclerosis of native coronary artery of native heart without angina pectoris   2. Mixed hyperlipidemia    PLAN:    In order of problems listed above:  1. ASCAD s/p PCI x 2 of the LAD - he has no anginal symptoms.  He will continue ASA, Plavix and statin.  He has lost 8lbs.  2. Hyperlipidemia - LDL goal < 70.  He will continue on Lipitor 80mg  daily.  I will check an FLP and ALT.    Medication Adjustments/Labs and Tests Ordered: Current medicines are reviewed at length with the patient today.  Concerns regarding medicines are outlined above.  No orders of the defined types were placed in this encounter.  No orders of the defined types were placed in this encounter.   Signed, Fransico Him, MD  10/31/2016 8:04 AM    [place]

## 2016-10-31 NOTE — Patient Instructions (Signed)
Medication Instructions:  Your physician recommends that you continue on your current medications as directed. Please refer to the Current Medication list given to you today.   Labwork: TODAY: cholesterol and liver  Testing/Procedures: None  Follow-Up: Your physician wants you to follow-up in: 1 year with [name]. You will receive a reminder letter in the mail two months in advance. If you don't receive a letter, please call our office to schedule the follow-up appointment.   Any Other Special Instructions Will Be Listed Below (If Applicable).     If you need a refill on your cardiac medications before your next appointment, please call your pharmacy.

## 2017-08-22 ENCOUNTER — Telehealth: Payer: Self-pay | Admitting: Cardiology

## 2017-08-22 NOTE — Telephone Encounter (Signed)
I spoke with patient. I advised patient to fast for OV. Pt is due for yearly lipid check. He stated understanding and thankful for the call

## 2017-08-22 NOTE — Telephone Encounter (Signed)
New Message:      Pt is calling to see if he needs to have labs done before his visit.

## 2017-10-22 NOTE — Progress Notes (Signed)
Cardiology Office Note:    Date:  10/23/2017   ID:  Dillon Nations., DOB 1945-05-31, MRN [id number]  PCP:  Cari Caraway, MD  Cardiologist:  No primary care provider on file.    Referring MD: Cari Caraway, MD   Chief Complaint  Patient presents with  . Coronary Artery Disease  . Hyperlipidemia    History of Present Illness:    Dillon Oki. is a 72 y.o. male with a hx of ASCAD with PCI of LAD and RCA in 2006 and then again for LAD restenosis in 2007  He also has a history of dyslipidemia..  he is here today for followup and is doing well.  He denies any chest pain or pressure, SOB, DOE, PND, orthopnea, LE edema, dizziness, palpitations or syncope. He is compliant with his meds and is tolerating meds with no SE.    Past Medical History:  Diagnosis Date  . Abnormal EKG    left anterio fasicula rblock on 11-05-15 ekg and 10-28-14 ekg epic  . Arthritis   . Carpal tunnel syndrome    Bilateral  . Coronary artery disease 10/26/2004   drug eluting stent placement to the mild LAD and RCA  2006/ PCI of LAD for restenosis 02/24/2006-Dr Turner  . Headache   . Heart murmur 11/05/2015  . History of kidney stones   . Hyperlipidemia   . Skin cancer    right scalp Dr Wilhemina Bonito    Past Surgical History:  Procedure Laterality Date  . APPENDECTOMY     age 75  . CARDIAC CATHETERIZATION    . COLONOSCOPY WITH PROPOFOL N/A 07/04/2016   Procedure: COLONOSCOPY WITH PROPOFOL;  Surgeon: Garlan Fair, MD;  Location: [facility];  Service: Endoscopy;  Laterality: N/A;  . CORONARY STENT PLACEMENT  2006 and 2007  . CYSTOSCOPY WITH RETROGRADE PYELOGRAM, URETEROSCOPY AND STENT PLACEMENT Left 06/11/2015   Procedure: CYSTOSCOPY WITH RETROGRADE PYELOGRAM, URETEROSCOPY AND BASKET STONE EXTRACTION STENT PLACEMENT;  Surgeon: Alexis Frock, MD;  Location: [facility];  Service: Urology;  Laterality: Left;  . HERNIA REPAIR  2003  . melanoma removal     from left forearm-Dr [facility]  . TONSILLECTOMY      age 72    Current Medications: Current Meds  Medication Sig  . aspirin EC 81 MG tablet Take 1 tablet (81 mg total) by mouth daily.  Marland Kitchen atorvastatin (LIPITOR) 80 MG tablet Take 80 mg by mouth daily.  Marland Kitchen atorvastatin (LIPITOR) 80 MG tablet TAKE ONE TABLET BY MOUTH ONCE DAILY  . clopidogrel (PLAVIX) 75 MG tablet Take 75 mg by mouth daily.  . diclofenac sodium (VOLTAREN) 1 % GEL Apply 2 g topically daily as needed (pain).   . Liniments (BLUE-EMU SUPER STRENGTH) CREA Apply 1 application topically daily as needed (arthritis).  . Misc Natural Products (TART CHERRY ADVANCED) CAPS Take 2 capsules by mouth daily.  . Multiple Vitamin (MULTIVITAMIN) tablet Take 1 tablet by mouth daily.  . naproxen sodium (ANAPROX) 220 MG tablet Take 440 mg by mouth daily as needed (pain).  . nitroGLYCERIN (NITROSTAT) 0.4 MG SL tablet Place 0.4 mg under the tongue every 5 (five) minutes as needed for chest pain.  . Omega-3 Fatty Acids (FISH OIL) 1200 MG CAPS Take 1,200 mg by mouth 2 (two) times daily.  . Triamcinolone Acetonide (NASACORT AQ NA) Place 1 spray into the nose daily as needed (congestion).     Allergies:   Mistletoe [viscum album]   Social History   Socioeconomic  History  . Marital status: Married    Spouse name: Not on file  . Number of children: Not on file  . Years of education: Not on file  . Highest education level: Not on file  Occupational History  . Not on file  Social Needs  . Financial resource strain: Not on file  . Food insecurity:    Worry: Not on file    Inability: Not on file  . Transportation needs:    Medical: Not on file    Non-medical: Not on file  Tobacco Use  . Smoking status: Former Smoker    Packs/day: 1.00    Years: 10.00    Pack years: 10.00    Types: Cigarettes    Last attempt to quit: 03/28/1978    Years since quitting: 39.6  . Smokeless tobacco: Never Used  Substance and Sexual Activity  . Alcohol use: Yes    Comment: rare  . Drug use: No  . Sexual  activity: Not on file  Lifestyle  . Physical activity:    Days per week: Not on file    Minutes per session: Not on file  . Stress: Not on file  Relationships  . Social connections:    Talks on phone: Not on file    Gets together: Not on file    Attends religious service: Not on file    Active member of club or organization: Not on file    Attends meetings of clubs or organizations: Not on file    Relationship status: Not on file  Other Topics Concern  . Not on file  Social History Narrative  . Not on file     Family History: The patient's family history includes Breast cancer in his sister; CAD in his mother; Heart attack in his father.  ROS:   Please see the history of present illness.    ROS  All other systems reviewed and negative.   EKGs/Labs/Other Studies Reviewed:    The following studies were reviewed today: none  EKG:  EKG is  ordered today.  The ekg ordered today demonstrates NSR with no ST changes  Recent Labs: 10/31/2016: ALT 13   Recent Lipid Panel    Component Value Date/Time   CHOL 136 10/31/2016 0817   TRIG 63 10/31/2016 0817   HDL 53 10/31/2016 0817   CHOLHDL 2.6 10/31/2016 0817   CHOLHDL 3 09/13/2013 0743   VLDL 18.2 09/13/2013 0743   LDLCALC 70 10/31/2016 0817    Physical Exam:    VS:  BP 130/80   Pulse 64   Ht 5\' 6"  (1.676 m)   Wt 157 lb 6.4 oz (71.4 kg)   SpO2 99%   BMI 25.41 kg/m     Wt Readings from Last 3 Encounters:  10/23/17 157 lb 6.4 oz (71.4 kg)  10/31/16 165 lb 6.4 oz (75 kg)  07/25/16 173 lb (78.5 kg)     GEN:  Well nourished, well developed in no acute distress HEENT: Normal NECK: No JVD; No carotid bruits LYMPHATICS: No lymphadenopathy CARDIAC: RRR, no  rubs, gallops.  2/6 SM at LLSB  RESPIRATORY:  Clear to auscultation without rales, wheezing or rhonchi  ABDOMEN: Soft, non-tender, non-distended MUSCULOSKELETAL:  No edema; No deformity  SKIN: Warm and dry NEUROLOGIC:  Alert and oriented x 3 PSYCHIATRIC:  Normal  affect   ASSESSMENT:    1. Atherosclerosis of native coronary artery of native heart without angina pectoris   2. Mixed hyperlipidemia   3. Heart murmur  PLAN:    In order of problems listed above:  1.  ASCAD -  S/p PCI of LAD and RCA in 2006 and then again for LAD restenosis in 2007.  He denies any anginal symptoms.  He will continue on atorvastatin 80 mg daily, aspirin 81 mg daily and Plavix 75 mg daily  2.  Hyperlipidemia -LDL goal less than 70 -LDL was 68 on 02/09/2018 and ALT normal at 14 -He will continue on atorvastatin 80 mg daily -repeat FLP and ALT today  3.  Heart murmur -mild TR in the past -repeat echo to assess for progression    Medication Adjustments/Labs and Tests Ordered: Current medicines are reviewed at length with the patient today.  Concerns regarding medicines are outlined above.  No orders of the defined types were placed in this encounter.  No orders of the defined types were placed in this encounter.   Signed, Fransico Him, MD  10/23/2017 8:12 AM    [place]

## 2017-10-23 ENCOUNTER — Encounter: Payer: Self-pay | Admitting: Cardiology

## 2017-10-23 ENCOUNTER — Ambulatory Visit (INDEPENDENT_AMBULATORY_CARE_PROVIDER_SITE_OTHER): Payer: Medicare Other | Admitting: Cardiology

## 2017-10-23 ENCOUNTER — Encounter (INDEPENDENT_AMBULATORY_CARE_PROVIDER_SITE_OTHER): Payer: Self-pay

## 2017-10-23 VITALS — BP 130/80 | HR 64 | Ht 66.0 in | Wt 157.4 lb

## 2017-10-23 DIAGNOSIS — R011 Cardiac murmur, unspecified: Secondary | ICD-10-CM

## 2017-10-23 DIAGNOSIS — E782 Mixed hyperlipidemia: Secondary | ICD-10-CM

## 2017-10-23 DIAGNOSIS — I2510 Atherosclerotic heart disease of native coronary artery without angina pectoris: Secondary | ICD-10-CM | POA: Diagnosis not present

## 2017-10-23 LAB — HEPATIC FUNCTION PANEL
ALK PHOS: 105 IU/L (ref 39–117)
ALT: 14 IU/L (ref 0–44)
AST: 17 IU/L (ref 0–40)
Albumin: 3.8 g/dL (ref 3.5–4.8)
Bilirubin Total: 0.4 mg/dL (ref 0.0–1.2)
Bilirubin, Direct: 0.15 mg/dL (ref 0.00–0.40)
Total Protein: 6.3 g/dL (ref 6.0–8.5)

## 2017-10-23 LAB — LIPID PANEL
CHOLESTEROL TOTAL: 117 mg/dL (ref 100–199)
Chol/HDL Ratio: 2.5 ratio (ref 0.0–5.0)
HDL: 47 mg/dL (ref >39)
LDL Calculated: 57 mg/dL (ref 0–99)
Triglycerides: 67 mg/dL (ref 0–149)
VLDL Cholesterol Cal: 13 mg/dL (ref 5–40)

## 2017-10-23 NOTE — Patient Instructions (Addendum)
Medication Instructions:  Your physician recommends that you continue on your current medications as directed. Please refer to the Current Medication list given to you today.  Labwork: Lipids and LFTs today  Testing/Procedures: Your physician has requested that you have an echocardiogram. Echocardiography is a painless test that uses sound waves to create images of your heart. It provides your doctor with information about the size and shape of your heart and how well your heart's chambers and valves are working. This procedure takes approximately one hour. There are no restrictions for this procedure.   Follow-Up: Your physician wants you to follow-up in: 1 year with [name]. You will receive a reminder letter in the mail two months in advance. If you don't receive a letter, please call our office to schedule the follow-up appointment.   Any Other Special Instructions Will Be Listed Below (If Applicable).     If you need a refill on your cardiac medications before your next appointment, please call your pharmacy.

## 2017-10-25 ENCOUNTER — Ambulatory Visit (HOSPITAL_COMMUNITY): Payer: Medicare Other | Attending: Cardiovascular Disease

## 2017-10-25 ENCOUNTER — Other Ambulatory Visit: Payer: Self-pay

## 2017-10-25 DIAGNOSIS — R011 Cardiac murmur, unspecified: Secondary | ICD-10-CM

## 2017-10-25 DIAGNOSIS — E785 Hyperlipidemia, unspecified: Secondary | ICD-10-CM | POA: Insufficient documentation

## 2017-11-20 ENCOUNTER — Other Ambulatory Visit: Payer: Self-pay | Admitting: Cardiology

## 2018-10-24 ENCOUNTER — Ambulatory Visit: Payer: Medicare Other | Admitting: Cardiology

## 2018-12-17 ENCOUNTER — Ambulatory Visit: Payer: Medicare Other | Admitting: Cardiology

## 2019-01-02 ENCOUNTER — Other Ambulatory Visit: Payer: Self-pay

## 2019-01-02 ENCOUNTER — Ambulatory Visit (INDEPENDENT_AMBULATORY_CARE_PROVIDER_SITE_OTHER): Payer: Medicare Other | Admitting: Cardiology

## 2019-01-02 ENCOUNTER — Encounter: Payer: Self-pay | Admitting: Cardiology

## 2019-01-02 VITALS — BP 158/76 | HR 66 | Ht 66.0 in | Wt 150.6 lb

## 2019-01-02 DIAGNOSIS — I2510 Atherosclerotic heart disease of native coronary artery without angina pectoris: Secondary | ICD-10-CM

## 2019-01-02 DIAGNOSIS — R030 Elevated blood-pressure reading, without diagnosis of hypertension: Secondary | ICD-10-CM

## 2019-01-02 DIAGNOSIS — R011 Cardiac murmur, unspecified: Secondary | ICD-10-CM | POA: Diagnosis not present

## 2019-01-02 DIAGNOSIS — E782 Mixed hyperlipidemia: Secondary | ICD-10-CM

## 2019-01-02 NOTE — Progress Notes (Signed)
Cardiology Office Note:    Date:  01/02/2019   ID:  Dillon Nations., DOB [birth year], MRN [id number]  PCP:  Cari Caraway, MD  Cardiologist:  No primary care provider on file.    Referring MD: Cari Caraway, MD   Chief Complaint  Patient presents with  . Coronary Artery Disease  . Hyperlipidemia    History of Present Illness:    Dillon Rios. is a 73 y.o. male with a hx of  ASCAD with PCI of LAD and RCA in 2006 and then againforLAD restenosis in 2007 He also has a history ofdyslipidemia.  He is here today for followup and is doing well.  He denies any chest pain or pressure, SOB, DOE, PND, orthopnea, LE edema, dizziness, palpitations or syncope. He is compliant with his meds and is tolerating meds with no SE.    Past Medical History:  Diagnosis Date  . Abnormal EKG    left anterio fasicula rblock on 11-05-15 ekg and 10-28-14 ekg epic  . Arthritis   . Carpal tunnel syndrome    Bilateral  . Coronary artery disease 10/26/2004   drug eluting stent placement to the mild LAD and RCA  2006/ PCI of LAD for restenosis 02/24/2006-Dr   . Headache   . Heart murmur 11/05/2015  . History of kidney stones   . Hyperlipidemia   . Skin cancer    right scalp Dr Wilhemina Bonito    Past Surgical History:  Procedure Laterality Date  . APPENDECTOMY     age 72  . CARDIAC CATHETERIZATION    . COLONOSCOPY WITH PROPOFOL N/A 07/04/2016   Procedure: COLONOSCOPY WITH PROPOFOL;  Surgeon: Garlan Fair, MD;  Location: [facility];  Service: Endoscopy;  Laterality: N/A;  . CORONARY STENT PLACEMENT  2006 and 2007  . CYSTOSCOPY WITH RETROGRADE PYELOGRAM, URETEROSCOPY AND STENT PLACEMENT Left 06/11/2015   Procedure: CYSTOSCOPY WITH RETROGRADE PYELOGRAM, URETEROSCOPY AND BASKET STONE EXTRACTION STENT PLACEMENT;  Surgeon: Alexis Frock, MD;  Location: [facility];  Service: Urology;  Laterality: Left;  . HERNIA REPAIR  2003  . melanoma removal     from left forearm-Dr [facility]  . TONSILLECTOMY      age 61    Current Medications: Current Meds  Medication Sig  . aspirin EC 81 MG tablet Take 1 tablet (81 mg total) by mouth daily.  Marland Kitchen atorvastatin (LIPITOR) 80 MG tablet TAKE 1 TABLET BY MOUTH ONCE DAILY  . clopidogrel (PLAVIX) 75 MG tablet Take 75 mg by mouth daily.  . diclofenac sodium (VOLTAREN) 1 % GEL Apply 2 g topically daily as needed (pain).   . Liniments (BLUE-EMU SUPER STRENGTH) CREA Apply 1 application topically daily as needed (arthritis).  . Misc Natural Products (TART CHERRY ADVANCED) CAPS Take 2 capsules by mouth daily.  . Multiple Vitamin (MULTIVITAMIN) tablet Take 1 tablet by mouth daily.  . naproxen sodium (ANAPROX) 220 MG tablet Take 440 mg by mouth daily as needed (pain).  . nitroGLYCERIN (NITROSTAT) 0.4 MG SL tablet Place 0.4 mg under the tongue every 5 (five) minutes as needed for chest pain.  . Omega-3 Fatty Acids (FISH OIL) 1200 MG CAPS Take 1,200 mg by mouth 2 (two) times daily.  . Triamcinolone Acetonide (NASACORT AQ NA) Place 1 spray into the nose daily as needed (congestion).     Allergies:   Mistletoe [viscum album]   Social History   Socioeconomic History  . Marital status: Married    Spouse name: Not on file  .  Number of children: Not on file  . Years of education: Not on file  . Highest education level: Not on file  Occupational History  . Not on file  Social Needs  . Financial resource strain: Not on file  . Food insecurity    Worry: Not on file    Inability: Not on file  . Transportation needs    Medical: Not on file    Non-medical: Not on file  Tobacco Use  . Smoking status: Former Smoker    Packs/day: 1.00    Years: 10.00    Pack years: 10.00    Types: Cigarettes    Quit date: 03/28/1978    Years since quitting: 40.7  . Smokeless tobacco: Never Used  Substance and Sexual Activity  . Alcohol use: Yes    Comment: rare  . Drug use: No  . Sexual activity: Not on file  Lifestyle  . Physical activity    Days per week: Not on file     Minutes per session: Not on file  . Stress: Not on file  Relationships  . Social Herbalist on phone: Not on file    Gets together: Not on file    Attends religious service: Not on file    Active member of club or organization: Not on file    Attends meetings of clubs or organizations: Not on file    Relationship status: Not on file  Other Topics Concern  . Not on file  Social History Narrative  . Not on file     Family History: The patient's family history includes Breast cancer in his sister; CAD in his mother; Heart attack in his father.  ROS:   Please see the history of present illness.    ROS  All other systems reviewed and negative.   EKGs/Labs/Other Studies Reviewed:    The following studies were reviewed today: none  EKG:  EKG is  ordered today.  The ekg ordered today demonstrates NSR at 66bpm with LAFB  Recent Labs: No results found for requested labs within last 8760 hours.   Recent Lipid Panel    Component Value Date/Time   CHOL 117 10/23/2017 0852   TRIG 67 10/23/2017 0852   HDL 47 10/23/2017 0852   CHOLHDL 2.5 10/23/2017 0852   CHOLHDL 3 09/13/2013 0743   VLDL 18.2 09/13/2013 0743   LDLCALC 57 10/23/2017 0852    Physical Exam:    VS:  BP (!) 158/76   Pulse 66   Ht 5\' 6"  (1.676 m)   Wt 150 lb 9.6 oz (68.3 kg)   BMI 24.31 kg/m     Wt Readings from Last 3 Encounters:  01/02/19 150 lb 9.6 oz (68.3 kg)  10/23/17 157 lb 6.4 oz (71.4 kg)  10/31/16 165 lb 6.4 oz (75 kg)     GEN:  Well nourished, well developed in no acute distress HEENT: Normal NECK: No JVD; No carotid bruits LYMPHATICS: No lymphadenopathy CARDIAC: RRR, no  rubs, gallops.  2/6 SM at RUSB to LLSB RESPIRATORY:  Clear to auscultation without rales, wheezing or rhonchi  ABDOMEN: Soft, non-tender, non-distended MUSCULOSKELETAL:  No edema; No deformity  SKIN: Warm and dry NEUROLOGIC:  Alert and oriented x 3 PSYCHIATRIC:  Normal affect   ASSESSMENT:    1.  Atherosclerosis of native coronary artery of native heart without angina pectoris   2. Mixed hyperlipidemia   3. Elevated blood pressure reading in office without diagnosis of hypertension  PLAN:    In order of problems listed above:  1.  ASCAD -s/p PCI of LAD and RCA in 2006 and then againforLAD restenosis in 2007  -denies any anginal sx -continue ASA 81mg  daily, Plavix 75mg  daily and statin  2.  HLD -LDL goal < 70 -LDL at the [place] was 62 and HDL 52 -continue Atorvastatin 80mg  daily  3.  Elevated BP -no dx of HTN in the past -he has white coat syndrome -BPs at home have ranged from 120-134/50-45mmHg.  4.  Heart murmur -he has a hx of trivial AI in the past -repeat echo to make sure AV is ok  Medication Adjustments/Labs and Tests Ordered: Current medicines are reviewed at length with the patient today.  Concerns regarding medicines are outlined above.  Orders Placed This Encounter  Procedures  . EKG 12-Lead   No orders of the defined types were placed in this encounter.   Signed, Fransico Him, MD  01/02/2019 9:29 AM    [place]

## 2019-01-02 NOTE — Patient Instructions (Signed)
Medication Instructions:   Your physician recommends that you continue on your current medications as directed. Please refer to the Current Medication list given to you today.  If you need a refill on your cardiac medications before your next appointment, please call your pharmacy.     Testing/Procedures:  Your physician has requested that you have an echocardiogram. Echocardiography is a painless test that uses sound waves to create images of your heart. It provides your doctor with information about the size and shape of your heart and how well your heart's chambers and valves are working. This procedure takes approximately one hour. There are no restrictions for this procedure.    Follow-Up: At [facility], you and your health needs are our priority.  As part of our continuing mission to provide you with exceptional heart care, we have created designated Provider Care Teams.  These Care Teams include your primary Cardiologist (physician) and Advanced Practice Providers (APPs -  Physician Assistants and Nurse Practitioners) who all work together to provide you with the care you need, when you need it. You will need a follow up appointment in 12 months.  Please call our office 2 months in advance to schedule this appointment.  You may see [name] or one of the following Advanced Practice Providers on your designated Care Team:   [place], PA-C Melina Copa, PA-C . Ermalinda Barrios, PA-C

## 2019-01-04 ENCOUNTER — Ambulatory Visit (HOSPITAL_COMMUNITY): Payer: Medicare Other | Attending: Cardiology

## 2019-01-04 ENCOUNTER — Other Ambulatory Visit: Payer: Self-pay

## 2019-01-04 DIAGNOSIS — R011 Cardiac murmur, unspecified: Secondary | ICD-10-CM | POA: Diagnosis present

## 2019-03-06 ENCOUNTER — Emergency Department (HOSPITAL_COMMUNITY): Payer: Medicare Other

## 2019-03-06 ENCOUNTER — Encounter (HOSPITAL_COMMUNITY): Payer: Self-pay

## 2019-03-06 ENCOUNTER — Other Ambulatory Visit: Payer: Self-pay

## 2019-03-06 ENCOUNTER — Emergency Department (HOSPITAL_COMMUNITY)
Admission: EM | Admit: 2019-03-06 | Discharge: 2019-03-06 | Disposition: A | Discharge status: Home / Self Care | Payer: Medicare Other | Attending: Emergency Medicine | Admitting: Emergency Medicine

## 2019-03-06 DIAGNOSIS — I10 Essential (primary) hypertension: Secondary | ICD-10-CM | POA: Insufficient documentation

## 2019-03-06 DIAGNOSIS — I2510 Atherosclerotic heart disease of native coronary artery without angina pectoris: Secondary | ICD-10-CM | POA: Insufficient documentation

## 2019-03-06 DIAGNOSIS — Z85828 Personal history of other malignant neoplasm of skin: Secondary | ICD-10-CM | POA: Insufficient documentation

## 2019-03-06 DIAGNOSIS — Z7982 Long term (current) use of aspirin: Secondary | ICD-10-CM | POA: Insufficient documentation

## 2019-03-06 DIAGNOSIS — Z87891 Personal history of nicotine dependence: Secondary | ICD-10-CM | POA: Diagnosis not present

## 2019-03-06 DIAGNOSIS — Z7901 Long term (current) use of anticoagulants: Secondary | ICD-10-CM | POA: Diagnosis not present

## 2019-03-06 LAB — BASIC METABOLIC PANEL
Anion gap: 9 (ref 5–15)
BUN: 20 mg/dL (ref 8–23)
CO2: 27 mmol/L (ref 22–32)
Calcium: 9.7 mg/dL (ref 8.9–10.3)
Chloride: 101 mmol/L (ref 98–111)
Creatinine, Ser: 1.07 mg/dL (ref 0.61–1.24)
GFR calc Af Amer: >60 mL/min (ref >60)
GFR calc non Af Amer: >60 mL/min (ref >60)
Glucose, Bld: 102 mg/dL — ABNORMAL HIGH (ref 70–99)
Potassium: 5.1 mmol/L (ref 3.5–5.1)
Sodium: 137 mmol/L (ref 135–145)

## 2019-03-06 LAB — CBC
HCT: 45 % (ref 39.0–52.0)
Hemoglobin: 14.9 g/dL (ref 13.0–17.0)
MCH: 32.4 pg (ref 26.0–34.0)
MCHC: 33.1 g/dL (ref 30.0–36.0)
MCV: 97.8 fL (ref 80.0–100.0)
Platelets: 214 10*3/uL (ref 150–400)
RBC: 4.6 MIL/uL (ref 4.22–5.81)
RDW: 13.7 % (ref 11.5–15.5)
WBC: 5.5 10*3/uL (ref 4.0–10.5)
nRBC: 0 % (ref 0.0–0.2)

## 2019-03-06 LAB — TROPONIN I (HIGH SENSITIVITY): Troponin I (High Sensitivity): 4 ng/L (ref <18)

## 2019-03-06 MED ORDER — AMLODIPINE BESYLATE 5 MG PO TABS: 5.0000 mg | ORAL_TABLET | Freq: Every day | ORAL | 0 refills | Status: DC

## 2019-03-06 NOTE — ED Provider Notes (Signed)
[facility] EMERGENCY DEPARTMENT Provider Note   CSN: [id number] Arrival date & time: 03/06/19  1315     History   Chief Complaint Chief Complaint  Patient presents with   Hypertension    HPI Dillon Rios. is a 73 y.o. male.     The history is provided by the patient.  Hypertension This is a new problem. The current episode started yesterday. The problem occurs daily. The problem has not changed since onset.Pertinent negatives include no chest pain, no abdominal pain, no headaches and no shortness of breath. Nothing aggravates the symptoms. Nothing relieves the symptoms.    Past Medical History:  Diagnosis Date   Abnormal EKG    left anterio fasicula rblock on 11-05-15 ekg and 10-28-14 ekg epic   Arthritis    Carpal tunnel syndrome    Bilateral   Coronary artery disease 10/26/2004   drug eluting stent placement to the mild LAD and RCA  2006/ PCI of LAD for restenosis 02/24/2006-Dr Turner   Headache    Heart murmur 11/05/2015   History of kidney stones    Hyperlipidemia    Skin cancer    right scalp Dr Wilhemina Bonito    Patient Active Problem List   Diagnosis Date Noted   Heart murmur 11/05/2015   Elevated BP 11/05/2015   Coronary atherosclerosis of native coronary artery 09/09/2013   Mixed hyperlipidemia 09/09/2013    Past Surgical History:  Procedure Laterality Date   APPENDECTOMY     age 75   CARDIAC CATHETERIZATION     COLONOSCOPY WITH PROPOFOL N/A 07/04/2016   Procedure: COLONOSCOPY WITH PROPOFOL;  Surgeon: Garlan Fair, MD;  Location: [facility];  Service: Endoscopy;  Laterality: N/A;   CORONARY STENT PLACEMENT  2006 and 2007   CYSTOSCOPY WITH RETROGRADE PYELOGRAM, URETEROSCOPY AND STENT PLACEMENT Left 06/11/2015   Procedure: CYSTOSCOPY WITH RETROGRADE PYELOGRAM, URETEROSCOPY AND BASKET STONE EXTRACTION STENT PLACEMENT;  Surgeon: Alexis Frock, MD;  Location: [facility];  Service: Urology;  Laterality: Left;   HERNIA  REPAIR  2003   melanoma removal     from left forearm-Dr [facility]   TONSILLECTOMY     age 6        Home Medications    Prior to Admission medications   Medication Sig Start Date End Date Taking? Authorizing Provider  amLODipine (NORVASC) 5 MG tablet Take 1 tablet (5 mg total) by mouth daily. 03/06/19 04/05/19  Joson Sapp, DO  aspirin EC 81 MG tablet Take 1 tablet (81 mg total) by mouth daily. 09/09/13   Sueanne Margarita, MD  atorvastatin (LIPITOR) 80 MG tablet TAKE 1 TABLET BY MOUTH ONCE DAILY 11/21/17   Sueanne Margarita, MD  clopidogrel (PLAVIX) 75 MG tablet Take 75 mg by mouth daily. 06/21/13   [provider]  diclofenac sodium (VOLTAREN) 1 % GEL Apply 2 g topically daily as needed (pain).     [provider]  Liniments (BLUE-EMU SUPER STRENGTH) CREA Apply 1 application topically daily as needed (arthritis).    [provider]  Misc Natural Products (TART CHERRY ADVANCED) CAPS Take 2 capsules by mouth daily.    [provider]  Multiple Vitamin (MULTIVITAMIN) tablet Take 1 tablet by mouth daily.    [provider]  naproxen sodium (ANAPROX) 220 MG tablet Take 440 mg by mouth daily as needed (pain).    [provider]  nitroGLYCERIN (NITROSTAT) 0.4 MG SL tablet Place 0.4 mg under the tongue every 5 (five)  minutes as needed for chest pain.    [provider]  Omega-3 Fatty Acids (FISH OIL) 1200 MG CAPS Take 1,200 mg by mouth 2 (two) times daily.    [provider]  Triamcinolone Acetonide (NASACORT AQ NA) Place 1 spray into the nose daily as needed (congestion).    [provider]    Family History Family History  Problem Relation Age of Onset   CAD Mother    Heart attack Father    Breast cancer Sister     Social History Social History   Tobacco Use   Smoking status: Former Smoker    Packs/day: 1.00    Years: 10.00    Pack years: 10.00    Types: Cigarettes    Quit date: 03/28/1978     Years since quitting: 40.9   Smokeless tobacco: Never Used  Substance Use Topics   Alcohol use: Yes    Comment: rare   Drug use: No     Allergies   Mistletoe [viscum album]   Review of Systems Review of Systems  Constitutional: Negative for chills and fever.  HENT: Negative for ear pain and sore throat.   Eyes: Negative for pain and visual disturbance.  Respiratory: Negative for cough and shortness of breath.   Cardiovascular: Negative for chest pain and palpitations.  Gastrointestinal: Negative for abdominal pain and vomiting.  Genitourinary: Negative for dysuria and hematuria.  Musculoskeletal: Negative for arthralgias and back pain.  Skin: Negative for color change and rash.  Neurological: Negative for seizures, syncope and headaches.  All other systems reviewed and are negative.    Physical Exam Updated Vital Signs  ED Triage Vitals  Enc Vitals Group     BP 03/06/19 1324 (!) 209/75     Pulse Rate 03/06/19 1324 (!) 58     Resp 03/06/19 1324 18     Temp 03/06/19 1324 98 F (36.7 C)     Temp Source 03/06/19 1324 Oral     SpO2 03/06/19 1317 98 %     Weight 03/06/19 1325 150 lb 9.2 oz (68.3 kg)     Height 03/06/19 1325 5\' 6"  (1.676 m)     Head Circumference --      Peak Flow --      Pain Score 03/06/19 1325 0     Pain Loc --      Pain Edu? --      Excl. in [place]? --     Physical Exam Vitals signs and nursing note reviewed.  Constitutional:      General: He is not in acute distress.    Appearance: He is well-developed. He is not ill-appearing.  HENT:     Head: Normocephalic and atraumatic.     Nose: Nose normal.     Mouth/Throat:     Mouth: Mucous membranes are moist.  Eyes:     Extraocular Movements: Extraocular movements intact.     Conjunctiva/sclera: Conjunctivae normal.     Pupils: Pupils are equal, round, and reactive to light.  Neck:     Musculoskeletal: Neck supple.  Cardiovascular:     Rate and Rhythm: Normal rate and regular rhythm.      Pulses: Normal pulses.     Heart sounds: Normal heart sounds. No murmur.  Pulmonary:     Effort: Pulmonary effort is normal. No respiratory distress.     Breath sounds: Normal breath sounds.  Abdominal:     Palpations: Abdomen is soft.     Tenderness: There is no  abdominal tenderness.  Musculoskeletal: Normal range of motion.  Skin:    General: Skin is warm and dry.     Capillary Refill: Capillary refill takes less than 2 seconds.  Neurological:     General: No focal deficit present.     Mental Status: He is alert and oriented to person, place, and time.     Cranial Nerves: No cranial nerve deficit.     Sensory: No sensory deficit.     Motor: No weakness.     Coordination: Coordination normal.      ED Treatments / Results  Labs (all labs ordered are listed, but only abnormal results are displayed) Labs Reviewed  BASIC METABOLIC PANEL - Abnormal; Notable for the following components:      Result Value   Glucose, Bld 102 (*)    All other components within normal limits  CBC  TROPONIN I (HIGH SENSITIVITY)    EKG EKG Interpretation  Date/Time:  Wednesday March 06 2019 13:27:51 EST Ventricular Rate:  58 PR Interval:  160 QRS Duration: 112 QT Interval:  408 QTC Calculation: 400 R Axis:   -44 Text Interpretation: Sinus bradycardia Left axis deviation Abnormal ECG Confirmed by Lennice Sites [phone]) on 03/06/2019 4:01:04 PM   Radiology Dg Chest 2 View  Result Date: 03/06/2019 CLINICAL DATA:  73 year old with acute hypertension, systolic pressure 123XX123. Remote former smoker who quit 40+ years ago. EXAM: CHEST - 2 VIEW COMPARISON:  06/11/2015 and earlier. FINDINGS: Cardiomediastinal silhouette unremarkable and unchanged. Lungs clear. Bronchovascular markings normal. Pulmonary vascularity normal. No visible pleural effusions. No pneumothorax. Degenerative changes involving the thoracic spine. No significant interval change. IMPRESSION: No acute cardiopulmonary disease. Stable  examination. Electronically Signed   By: Evangeline Dakin M.D.   On: 03/06/2019 14:00    Procedures Procedures (including critical care time)  Medications Ordered in ED Medications - No data to display   Initial Impression / Assessment and Plan / ED Course  I have reviewed the triage vital signs and the nursing notes.  Pertinent labs & imaging results that were available during my care of the patient were reviewed by me and considered in my medical decision making (see chart for details).        Dillon Rios. is a 73 year old male with history of high cholesterol, CAD who presents to the ED with hypertension.  Patient with elevated blood pressure of 209/75 on arrival.  On recheck blood pressure is 178/60.  Patient has no chest pain, no stroke symptoms.  Asymptomatic.  Lab work shows no significant anemia, electrolyte abnormality, kidney injury.  Chest x-ray shows no signs of pneumonia, no volume overload.  Patient overall very well-appearing.  Started to recheck his blood pressure today.  Had checked his blood pressure in the past but never needed blood pressure medications.  Will prescribe amlodipine if patient continues to have elevated blood pressures at home.  Recommend close follow-up with primary care doctor.  No concern for stroke or ACS.  Given return precautions and discharged from ED in good condition.  This chart was dictated using voice recognition software.  Despite best efforts to proofread,  errors can occur which can change the documentation meaning.    Final Clinical Impressions(s) / ED Diagnoses   Final diagnoses:  Hypertension, unspecified type    ED Discharge Orders         Ordered    amLODipine (NORVASC) 5 MG tablet  Daily     03/06/19 1623  Lennice Sites, DO 03/06/19 1626

## 2019-03-06 NOTE — Discharge Instructions (Addendum)
Start amlodipine if you consistently have blood pressures in the 150/90 and follow-up with your primary care doctor.  Please return to the ED if you develop severe chest pain, stroke symptoms as discussed.

## 2019-03-06 NOTE — ED Triage Notes (Signed)
Pt BIB [facility] for eval of HTN. Pt reports that he felt dizzy and had some chills last few days, pt checked BP at home this AM and noted that he was hypertensive to 200/100. Denies CP/HA/N/V/DSOB. Pt denies pain to EMS.

## 2019-04-09 ENCOUNTER — Telehealth: Payer: Self-pay | Admitting: *Deleted

## 2019-04-09 NOTE — Telephone Encounter (Signed)
Hi [name]. Patient has upcoming right inguinal hernia repair with mesh planned (date TBD). Can you please comment on how long patient can hold Plavix for? He has a history of CAD s/p PCI to LAD and RCA in 2006 and then again to LAD for restenosis in 2007. Also has a history of hyperlipidemia. You last saw him in 12/2018 and he was doing well at that time.  Please route response back to P CV DIV PREOP.  Thank you!

## 2019-04-09 NOTE — Telephone Encounter (Signed)
   [facility] Pre-operative Risk Assessment    Request for surgical clearance:  1. What type of surgery is being performed? RECURRENT RIGHT INGUINAL HERNIA REPAIR w/MESH  2. When is this surgery scheduled? TBD   3. What type of clearance is required (medical clearance vs. Pharmacy clearance to hold med vs. Both)? MEDICAL  4. Are there any medications that need to be held prior to surgery and how long? PLAVIX    5. Practice name and name of physician performing surgery? [facility]; DR. Harrell Gave WHITE   6. What is your office phone number [phone]    7.   What is your office fax number [phone] ATTN: JACQUELINE HAGGETT, RMA  8.   Anesthesia type (None, local, MAC, general) ? GENERAL   Julaine Hua 04/09/2019, 3:03 PM  _________________________________________________________________   (provider comments below)

## 2019-04-10 NOTE — Telephone Encounter (Signed)
OK to hold Plavix for surgery as long as surgeon needs

## 2019-04-10 NOTE — Telephone Encounter (Signed)
Left message to call back and ask to speak to the pre-op team.

## 2019-04-11 NOTE — Telephone Encounter (Signed)
   Primary Cardiologist: Fransico Him, MD  Chart reviewed as part of pre-operative protocol coverage. Patient last seen by [name] on 01/02/2019 at which time he was doing well from a cardiac standpoint. I called and spoke with patient today. He was seen in the ED on 03/06/2019 for marked hypertension (BP as high as 209/75) but was asymptomatic with this. Patient had recently started a cream that his dermatologist prescribed and he thinks it was related to that. He has since stopped using this cream. He has been keeping a log of his BP at home for his PCP since that time and it has been back in its normal range (systolic BP in the 123XX123 to 150's). He has been doing well from a cardiac standpoint. He denies any chest pain, shortness of breath, orthopnea, PND, or edema. He notes occasional brief episodes of lightheadedness but denies any palpitations or syncope. He is able to complete 4.0 METS without any anginal symptoms. Per Revised Cardiac Risk Index, considered low risk. Given past medical history and time since last visit, based on ACC/AHA guidelines, Frasier Springborn. would be at acceptable risk for the planned procedure without further cardiovascular testing.   Per [name], "OK to hold Plavix for surgery as long as surgeon needs."  I will route this recommendation to the requesting party via Bud fax function and remove from pre-op pool.  Please call with questions.  Darreld Mclean, PA-C 04/11/2019, 10:27 AM

## 2019-04-19 ENCOUNTER — Ambulatory Visit: Payer: Self-pay | Admitting: General Surgery

## 2019-04-19 NOTE — H&P (Signed)
Dillon Rios Documented: 04/19/2019 8:34 AM Location: [facility] Patient #: [id number] DOB: 21-Jul-1945 Married / Language: Cleophus Molt / Race: White Male  History of Present Illness Randall Hiss M. Jolyssa Oplinger MD; 04/19/2019 9:15 AM) The patient is a 74 year old male who presents with an inguinal hernia. He comes in to discuss right-sided groin pain. He saw one of my partners about a week and a half ago and was diagnosed with a recurrent right inguinal hernia. He was sent to me to discuss the laparoscopic approach. I reviewed Dr. Orest Dikes note from January 12. He states that he underwent an open repair of a right inguinal hernia in the mid 2000s. Mesh was used and he thinks it was a plug mesh. He states the incision has always been puffy but a few months ago he noticed a bulge in his right groin. It causes him pain at times. He said one or 2 episodes of severe pain which felt like a kidney stone. He denies any burning, shooting or stabbing pain. He generally has a bowel movement at least every other day to generally daily. He does complain of some increased flatulence. He denies any significant BPH symptoms. He denies any melena or hematochezia. He had a colonoscopy in 2018. He is on Plavix. He has had stents placed in his coronary arteries in the mid to thousands for coronary artery disease. He denies any chest pain, chest pressure, source of breath, dyspnea on exertion or orthopnea. We have received cardiac clearance for surgery.   Problem List/Past Medical Randall Hiss M. Redmond Pulling, MD; 04/19/2019 9:16 AM) ANTIPLATELET OR ANTITHROMBOTIC LONG-TERM USE (Z79.02) HYPERTENSION, ESSENTIAL (I10) CAD IN NATIVE ARTERY (I25.10) RECURRENT RIGHT INGUINAL HERNIA (K40.91) Mr. Hornaday is a very pleasant 41yoM with HTN, HLD, CAD (s/p PCI/stents, on plavix), kidney stones, mild tricuspid regurg - here with symptomatic, recurrent right inguinal hernia in setting of prior open repair with mesh  This  patient encounter took 32 minutes today to perform the following: take history, perform exam, review outside records, interpret imaging, counsel the patient on their diagnosis and document encounter, findings & plan in the EHR  Past Surgical History Randall Hiss M. Redmond Pulling, MD; 04/19/2019 9:16 AM) Open Inguinal Hernia Surgery Right. Shoulder Surgery Left. Valve Replacement  Allergies (Chanel Teressa Senter, CMA; 04/19/2019 8:34 AM) No Known Allergies [04/09/2019]: Allergies Reconciled  Medication History (Chanel Teressa Senter, CMA; 04/19/2019 8:34 AM) Plavix (75MG  Tablet, Oral) Active. Atorvastatin Calcium (Oral) Specific strength unknown - Active. Aspirin (81MG  Tablet Chewable, Oral) Active. Mens Multi Vitamin & Mineral (Oral) Specific strength unknown - Active. Fish Oil (1200MG  Capsule, Oral) Active. Aleve (Oral) Specific strength unknown - Active. Tart Cherry Advanced (Oral) Active. Medications Reconciled  Social History Randall Hiss M. Redmond Pulling, MD; 04/19/2019 9:16 AM) Alcohol use Occasional alcohol use. Caffeine use Carbonated beverages, Coffee, Tea. Tobacco use Former smoker.  Family History Randall Hiss M. Redmond Pulling, MD; 04/19/2019 9:16 AM) Breast Cancer Sister. Melanoma Family Members In General.  Other Problems Randall Hiss M. Redmond Pulling, MD; 04/19/2019 9:16 AM) Arthritis     Review of Systems Randall Hiss M. Jenilee Franey MD; 04/19/2019 9:13 AM) All other systems negative  Vitals (Chanel Nolan CMA; 04/19/2019 8:35 AM) 04/19/2019 8:34 AM Weight: 155.38 lb Height: 65.5in Body Surface Area: 1.79 m Body Mass Index: 25.46 kg/m  Temp.: 97.60F  Pulse: 70 (Regular)  BP: 145/62 (Sitting, Left Arm, Standard)        Physical Exam Randall Hiss M. Aalayah Riles MD; 04/19/2019 9:13 AM)  General Mental Status-Alert. General Appearance-Consistent with stated age. Posture-Normal posture. Voice-Normal.  Integumentary  Note: no rash or lesion on limited exam  Head and Neck Head -Note:atraumatic,  normocephalic.  Face Strength and Tone - facial muscle strength and tone is normal.  Eye Eyeball - Bilateral-Extraocular movements intact. Sclera/Conjunctiva - Bilateral-Normal.  Chest and Lung Exam Chest and lung exam reveals -quiet, even and easy respiratory effort with no use of accessory muscles. Auscultation Breath sounds - Normal.  Cardiovascular Auscultation Rhythm - Regular.  Abdomen Note: Soft, nontender, nondistended. No umbilical hernia  Male Genitourinary Note: Both testicles descended. Some bilateral testicular atrophy. Old right groin incision. Positive bulge in right groin. Reducible. No bulge with Valsalva in left groin  Neuropsychiatric Mental status exam performed with findings of-able to articulate well with normal speech/language, rate, volume and coherence. The patient's mood and affect are described as -normal. Judgment and Insight-insight is appropriate concerning matters relevant to self and the patient displays appropriate judgment regarding every day activities. Thought Processes/Cognitive Function-aware of current events.  Musculoskeletal Note: strength symmetrical throughout, no deformity    Assessment & Plan Randall Hiss M. Ieasha Boerema MD; 04/19/2019 9:16 AM)  RECURRENT RIGHT INGUINAL HERNIA (K40.91) Story: Mr. Longaker is a very pleasant 45yoM with HTN, HLD, CAD (s/p PCI/stents, on plavix), kidney stones, mild tricuspid regurg - here with symptomatic, recurrent right inguinal hernia in setting of prior open repair with mesh  This patient encounter took 32 minutes today to perform the following: take history, perform exam, review outside records, interpret imaging, counsel the patient on their diagnosis and document encounter, findings & plan in the EHR Impression: We discussed the etiology of inguinal hernias. We discussed the signs & symptoms of incarceration & strangulation. We discussed non-operative and operative management. since he has  a recurrent inguinal hernia I recommended a laparoscopic approach  The patient has elected to proceed with laparoscopic repair of recurrent right inguinal hernia with mesh   I described the procedure in detail. The patient was given educational material. We discussed the risks and benefits including but not limited to bleeding, infection, chronic inguinal pain, nerve entrapment, hernia recurrence, mesh complications, hematoma formation, urinary retention, injury to the testicles, numbness in the groin, blood clots, injury to the surrounding structures, and anesthesia risk. We also discussed the typical post operative recovery course, including no heavy lifting for 4-6 weeks. I explained that the likelihood of improvement of their symptoms is good. I explained that I don't think hernia repair would improve his flatulence. We discussed adopting a high-fiber diet slowly. We also discussed the possibility conversion to open.  Current Plans You are being scheduled for surgery- Our schedulers will call you.  You should hear from our office's scheduling department within 5 working days about the location, date, and time of surgery. We try to make accommodations for patient's preferences in scheduling surgery, but sometimes the OR schedule or the surgeon's schedule prevents [place] from making those accommodations.  If you have not heard from our office [phone]) in 5 working days, call the office and ask for your surgeon's nurse.  If you have other questions about your diagnosis, plan, or surgery, call the office and ask for your surgeon's nurse.   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 He was felt to be low risk. We have permission to hold Plavix. I also reviewed his cardiology notes from October 2020. I  also reviewed his  echocardiogram from October 2020 which showed a good ejection fraction.  Leighton Ruff. Redmond Pulling, MD, FACS General, Bariatric, & Minimally Invasive Surgery [facility], [place]

## 2019-06-13 ENCOUNTER — Encounter (HOSPITAL_BASED_OUTPATIENT_CLINIC_OR_DEPARTMENT_OTHER): Payer: Self-pay | Admitting: General Surgery

## 2019-06-13 ENCOUNTER — Other Ambulatory Visit: Payer: Self-pay

## 2019-06-13 NOTE — Progress Notes (Signed)
Anesthesia Review:  PCP:Dr. Carlis Stable Cardiologist : Dr. Ashok Norris last office visit 01/02/2019, Clearance from c. Patsi Sears. 02/07/20 in epic under telephone encounter Chest x-ray : 03/06/2019 in epic EKG : 03/07/19 in epic Echo : 01/04/2019 in epic Stress test: greater than 2 years Cardiac Cath : greater than 2 years Sleep Study/ CPAP : N/A Fasting Blood Sugar :  N/A    / Checks Blood Sugar -- times a day:   Blood Thinner/ Instructions /Last Dose: 06/19/19 per patient ASA / Instructions/ Last Dose : 06/19/19 per patient  Has completed COVID 19 vaccine series  Patient denies shortness of breath, chest pain, fever, and cough at this phone interview.

## 2019-06-13 NOTE — Progress Notes (Signed)
Spoke with: Lynann Bologna and Vladimir Faster NPO:  After Midnight, no gum, candy, or mints   Arrival time: 51AM Lab needs dos---- N/A             Lab results------CBC/DIFF, BMP COVID test ------06/18/19 AT 8:25 am Medications to take morning of surgery: None Pre op orders in epic: Yes Diabetic medication -----N/A Patient Special Instructions -----N/A Pre-Op special Istructions -----N/A Ride home: Stanton Kidney (wife) [phone]  Patient verbalized understanding of instructions that were given at this phone interview. Patient denies shortness of breath, chest pain, fever, cough a this phone interview.

## 2019-06-18 ENCOUNTER — Other Ambulatory Visit (HOSPITAL_COMMUNITY)
Admission: RE | Admit: 2019-06-18 | Discharge: 2019-06-18 | Disposition: A | Discharge status: Home / Self Care | Payer: Medicare Other | Source: Ambulatory Visit | Attending: General Surgery | Admitting: General Surgery

## 2019-06-18 DIAGNOSIS — K4091 Unilateral inguinal hernia, without obstruction or gangrene, recurrent: Secondary | ICD-10-CM | POA: Diagnosis present

## 2019-06-18 DIAGNOSIS — M1990 Unspecified osteoarthritis, unspecified site: Secondary | ICD-10-CM | POA: Diagnosis not present

## 2019-06-18 DIAGNOSIS — Z7982 Long term (current) use of aspirin: Secondary | ICD-10-CM | POA: Diagnosis not present

## 2019-06-18 DIAGNOSIS — I2510 Atherosclerotic heart disease of native coronary artery without angina pectoris: Secondary | ICD-10-CM | POA: Diagnosis not present

## 2019-06-18 DIAGNOSIS — Z85820 Personal history of malignant melanoma of skin: Secondary | ICD-10-CM | POA: Diagnosis not present

## 2019-06-18 DIAGNOSIS — Z803 Family history of malignant neoplasm of breast: Secondary | ICD-10-CM | POA: Diagnosis not present

## 2019-06-18 DIAGNOSIS — Z87891 Personal history of nicotine dependence: Secondary | ICD-10-CM | POA: Diagnosis not present

## 2019-06-18 DIAGNOSIS — Z791 Long term (current) use of non-steroidal anti-inflammatories (NSAID): Secondary | ICD-10-CM | POA: Diagnosis not present

## 2019-06-18 DIAGNOSIS — E785 Hyperlipidemia, unspecified: Secondary | ICD-10-CM | POA: Diagnosis not present

## 2019-06-18 DIAGNOSIS — Z955 Presence of coronary angioplasty implant and graft: Secondary | ICD-10-CM | POA: Diagnosis not present

## 2019-06-18 DIAGNOSIS — Z8249 Family history of ischemic heart disease and other diseases of the circulatory system: Secondary | ICD-10-CM | POA: Diagnosis not present

## 2019-06-18 DIAGNOSIS — Z7902 Long term (current) use of antithrombotics/antiplatelets: Secondary | ICD-10-CM | POA: Diagnosis not present

## 2019-06-18 DIAGNOSIS — Z87442 Personal history of urinary calculi: Secondary | ICD-10-CM | POA: Diagnosis not present

## 2019-06-18 DIAGNOSIS — I10 Essential (primary) hypertension: Secondary | ICD-10-CM | POA: Diagnosis not present

## 2019-06-18 DIAGNOSIS — Z79899 Other long term (current) drug therapy: Secondary | ICD-10-CM | POA: Diagnosis not present

## 2019-06-18 LAB — SARS CORONAVIRUS 2 (TAT 6-24 HRS): SARS Coronavirus 2: NEGATIVE

## 2019-06-18 NOTE — Progress Notes (Signed)
Received cbc with dif, cmet, ua done [facility]-17-2019 from [facility] placed on chart.

## 2019-06-20 NOTE — Anesthesia Preprocedure Evaluation (Addendum)
Anesthesia Evaluation  Patient identified by MRN, date of birth, ID band Patient awake    Reviewed: Allergy & Precautions, NPO status , Patient's Chart, lab work & pertinent test results  History of Anesthesia Complications Negative for: history of anesthetic complications  Airway Mallampati: II  TM Distance: >3 FB Neck ROM: Full    Dental  (+) Dental Advisory Given, Missing, Chipped,    Pulmonary former smoker,    Pulmonary exam normal        Cardiovascular + CAD and + Cardiac Stents (2006, 2007)   Rhythm:Regular Rate:Normal  TTE 12/2018: EF 60-65%, mild AS     Neuro/Psych negative neurological ROS  negative psych ROS   GI/Hepatic Neg liver ROS, Right inguinal hernia   Endo/Other  negative endocrine ROS  Renal/GU negative Renal ROS  negative genitourinary   Musculoskeletal  (+) Arthritis ,   Abdominal   Peds  Hematology negative hematology ROS (+)   Anesthesia Other Findings Day of surgery medications reviewed with patient.  Reproductive/Obstetrics negative OB ROS                           Anesthesia Physical Anesthesia Plan  ASA: III  Anesthesia Plan: General   Post-op Pain Management:    Induction: Intravenous  PONV Risk Score and Plan: 3 and Treatment may vary due to age or medical condition, Ondansetron and Dexamethasone  Airway Management Planned: Oral ETT  Additional Equipment: None  Intra-op Plan:   Post-operative Plan: Extubation in OR  Informed Consent: I have reviewed the patients History and Physical, chart, labs and discussed the procedure including the risks, benefits and alternatives for the proposed anesthesia with the patient or authorized representative who has indicated his/her understanding and acceptance.     Dental advisory given  Plan Discussed with: CRNA  Anesthesia Plan Comments:        Anesthesia Quick Evaluation

## 2019-06-21 ENCOUNTER — Ambulatory Visit (HOSPITAL_BASED_OUTPATIENT_CLINIC_OR_DEPARTMENT_OTHER): Payer: Medicare Other | Admitting: Anesthesiology

## 2019-06-21 ENCOUNTER — Ambulatory Visit (HOSPITAL_BASED_OUTPATIENT_CLINIC_OR_DEPARTMENT_OTHER)
Admission: RE | Admit: 2019-06-21 | Discharge: 2019-06-21 | Disposition: A | Discharge status: Home / Self Care | Payer: Medicare Other | Attending: General Surgery | Admitting: General Surgery

## 2019-06-21 ENCOUNTER — Encounter (HOSPITAL_BASED_OUTPATIENT_CLINIC_OR_DEPARTMENT_OTHER)
Admission: RE | Disposition: A | Discharge status: Home / Self Care | Payer: Self-pay | Source: Home / Self Care | Attending: General Surgery

## 2019-06-21 ENCOUNTER — Encounter (HOSPITAL_BASED_OUTPATIENT_CLINIC_OR_DEPARTMENT_OTHER): Payer: Self-pay | Admitting: General Surgery

## 2019-06-21 ENCOUNTER — Other Ambulatory Visit: Payer: Self-pay

## 2019-06-21 DIAGNOSIS — Z7902 Long term (current) use of antithrombotics/antiplatelets: Secondary | ICD-10-CM | POA: Insufficient documentation

## 2019-06-21 DIAGNOSIS — Z79899 Other long term (current) drug therapy: Secondary | ICD-10-CM | POA: Insufficient documentation

## 2019-06-21 DIAGNOSIS — Z791 Long term (current) use of non-steroidal anti-inflammatories (NSAID): Secondary | ICD-10-CM | POA: Insufficient documentation

## 2019-06-21 DIAGNOSIS — Z7982 Long term (current) use of aspirin: Secondary | ICD-10-CM | POA: Insufficient documentation

## 2019-06-21 DIAGNOSIS — E785 Hyperlipidemia, unspecified: Secondary | ICD-10-CM | POA: Insufficient documentation

## 2019-06-21 DIAGNOSIS — Z803 Family history of malignant neoplasm of breast: Secondary | ICD-10-CM | POA: Insufficient documentation

## 2019-06-21 DIAGNOSIS — Z955 Presence of coronary angioplasty implant and graft: Secondary | ICD-10-CM | POA: Diagnosis not present

## 2019-06-21 DIAGNOSIS — I2510 Atherosclerotic heart disease of native coronary artery without angina pectoris: Secondary | ICD-10-CM | POA: Insufficient documentation

## 2019-06-21 DIAGNOSIS — Z85820 Personal history of malignant melanoma of skin: Secondary | ICD-10-CM | POA: Insufficient documentation

## 2019-06-21 DIAGNOSIS — K4091 Unilateral inguinal hernia, without obstruction or gangrene, recurrent: Secondary | ICD-10-CM | POA: Insufficient documentation

## 2019-06-21 DIAGNOSIS — I10 Essential (primary) hypertension: Secondary | ICD-10-CM | POA: Insufficient documentation

## 2019-06-21 DIAGNOSIS — Z87891 Personal history of nicotine dependence: Secondary | ICD-10-CM | POA: Insufficient documentation

## 2019-06-21 DIAGNOSIS — Z87442 Personal history of urinary calculi: Secondary | ICD-10-CM | POA: Insufficient documentation

## 2019-06-21 DIAGNOSIS — M1990 Unspecified osteoarthritis, unspecified site: Secondary | ICD-10-CM | POA: Insufficient documentation

## 2019-06-21 DIAGNOSIS — Z8249 Family history of ischemic heart disease and other diseases of the circulatory system: Secondary | ICD-10-CM | POA: Insufficient documentation

## 2019-06-21 HISTORY — DX: Presence of spectacles and contact lenses: Z97.3

## 2019-06-21 HISTORY — PX: INGUINAL HERNIA REPAIR: SHX194

## 2019-06-21 HISTORY — DX: Unilateral inguinal hernia, without obstruction or gangrene, not specified as recurrent: K40.90

## 2019-06-21 LAB — BASIC METABOLIC PANEL
Anion gap: 8 (ref 5–15)
BUN: 29 mg/dL — ABNORMAL HIGH (ref 8–23)
CO2: 23 mmol/L (ref 22–32)
Calcium: 9 mg/dL (ref 8.9–10.3)
Chloride: 107 mmol/L (ref 98–111)
Creatinine, Ser: 0.96 mg/dL (ref 0.61–1.24)
GFR calc Af Amer: >60 mL/min (ref >60)
GFR calc non Af Amer: >60 mL/min (ref >60)
Glucose, Bld: 112 mg/dL — ABNORMAL HIGH (ref 70–99)
Potassium: 4.1 mmol/L (ref 3.5–5.1)
Sodium: 138 mmol/L (ref 135–145)

## 2019-06-21 LAB — CBC WITH DIFFERENTIAL/PLATELET
Abs Immature Granulocytes: 0.01 10*3/uL (ref 0.00–0.07)
Basophils Absolute: 0 10*3/uL (ref 0.0–0.1)
Basophils Relative: 1 %
Eosinophils Absolute: 0.3 10*3/uL (ref 0.0–0.5)
Eosinophils Relative: 6 %
HCT: 42.2 % (ref 39.0–52.0)
Hemoglobin: 14 g/dL (ref 13.0–17.0)
Immature Granulocytes: 0 %
Lymphocytes Relative: 35 %
Lymphs Abs: 1.8 10*3/uL (ref 0.7–4.0)
MCH: 33.5 pg (ref 26.0–34.0)
MCHC: 33.2 g/dL (ref 30.0–36.0)
MCV: 101 fL — ABNORMAL HIGH (ref 80.0–100.0)
Monocytes Absolute: 0.4 10*3/uL (ref 0.1–1.0)
Monocytes Relative: 7 %
Neutro Abs: 2.7 10*3/uL (ref 1.7–7.7)
Neutrophils Relative %: 51 %
Platelets: 179 10*3/uL (ref 150–400)
RBC: 4.18 MIL/uL — ABNORMAL LOW (ref 4.22–5.81)
RDW: 13.6 % (ref 11.5–15.5)
WBC: 5.2 10*3/uL (ref 4.0–10.5)
nRBC: 0 % (ref 0.0–0.2)

## 2019-06-21 SURGERY — REPAIR, HERNIA, INGUINAL, LAPAROSCOPIC
Anesthesia: General | Site: Abdomen | Laterality: Right

## 2019-06-21 MED ORDER — METHYLENE BLUE 0.5 % INJ SOLN
INTRAVENOUS | Status: DC | PRN
  Administered 2019-06-21: 1 mL

## 2019-06-21 MED ORDER — SUGAMMADEX SODIUM 200 MG/2ML IV SOLN
INTRAVENOUS | Status: DC | PRN
  Administered 2019-06-21: 200 mg via INTRAVENOUS

## 2019-06-21 MED ORDER — OXYCODONE HCL 5 MG PO TABS
5.0000 mg | ORAL_TABLET | Freq: Once | ORAL | Status: DC | PRN
  Filled 2019-06-21: qty 1

## 2019-06-21 MED ORDER — DEXAMETHASONE SODIUM PHOSPHATE 10 MG/ML IJ SOLN
INTRAMUSCULAR | Status: AC
  Filled 2019-06-21: qty 1

## 2019-06-21 MED ORDER — OXYCODONE HCL 5 MG/5ML PO SOLN
5.0000 mg | Freq: Once | ORAL | Status: DC | PRN
  Filled 2019-06-21: qty 5

## 2019-06-21 MED ORDER — ONDANSETRON HCL 4 MG/2ML IJ SOLN
INTRAMUSCULAR | Status: AC
  Filled 2019-06-21: qty 2

## 2019-06-21 MED ORDER — MIDAZOLAM HCL 2 MG/2ML IJ SOLN
INTRAMUSCULAR | Status: DC | PRN
  Administered 2019-06-21: 1 mg via INTRAVENOUS

## 2019-06-21 MED ORDER — ROCURONIUM BROMIDE 10 MG/ML (PF) SYRINGE
PREFILLED_SYRINGE | INTRAVENOUS | Status: AC
  Filled 2019-06-21: qty 10

## 2019-06-21 MED ORDER — FENTANYL CITRATE (PF) 100 MCG/2ML IJ SOLN
INTRAMUSCULAR | Status: AC
  Filled 2019-06-21: qty 2

## 2019-06-21 MED ORDER — FENTANYL CITRATE (PF) 100 MCG/2ML IJ SOLN
25.0000 ug | INTRAMUSCULAR | Status: DC | PRN
  Administered 2019-06-21: 11:00:00 50 ug via INTRAVENOUS
  Filled 2019-06-21: qty 1

## 2019-06-21 MED ORDER — CEFAZOLIN SODIUM-DEXTROSE 2-4 GM/100ML-% IV SOLN
2.0000 g | INTRAVENOUS | Status: AC
  Administered 2019-06-21: 2 g via INTRAVENOUS
  Filled 2019-06-21: qty 100

## 2019-06-21 MED ORDER — CHLORHEXIDINE GLUCONATE CLOTH 2 % EX PADS
6.0000 | MEDICATED_PAD | Freq: Once | CUTANEOUS | Status: DC
  Filled 2019-06-21: qty 6

## 2019-06-21 MED ORDER — ROCURONIUM BROMIDE 10 MG/ML (PF) SYRINGE
PREFILLED_SYRINGE | INTRAVENOUS | Status: DC | PRN
  Administered 2019-06-21: 20 mg via INTRAVENOUS
  Administered 2019-06-21: 40 mg via INTRAVENOUS
  Administered 2019-06-21: 10 mg via INTRAVENOUS

## 2019-06-21 MED ORDER — OXYCODONE HCL 5 MG PO TABS: 5.0000 mg | ORAL_TABLET | Freq: Four times a day (QID) | ORAL | 0 refills | Status: AC | PRN

## 2019-06-21 MED ORDER — ARTIFICIAL TEARS OPHTHALMIC OINT
TOPICAL_OINTMENT | OPHTHALMIC | Status: AC
  Filled 2019-06-21: qty 3.5

## 2019-06-21 MED ORDER — PROPOFOL 10 MG/ML IV BOLUS
INTRAVENOUS | Status: AC
  Filled 2019-06-21: qty 40

## 2019-06-21 MED ORDER — PROMETHAZINE HCL 25 MG/ML IJ SOLN
6.2500 mg | INTRAMUSCULAR | Status: DC | PRN
  Filled 2019-06-21: qty 1

## 2019-06-21 MED ORDER — DEXAMETHASONE SODIUM PHOSPHATE 10 MG/ML IJ SOLN
INTRAMUSCULAR | Status: DC | PRN
  Administered 2019-06-21: 10 mg via INTRAVENOUS

## 2019-06-21 MED ORDER — ONDANSETRON HCL 4 MG/2ML IJ SOLN
INTRAMUSCULAR | Status: DC | PRN
  Administered 2019-06-21: 4 mg via INTRAVENOUS

## 2019-06-21 MED ORDER — PROPOFOL 10 MG/ML IV BOLUS
INTRAVENOUS | Status: DC | PRN
  Administered 2019-06-21: 150 mg via INTRAVENOUS

## 2019-06-21 MED ORDER — CEFAZOLIN SODIUM-DEXTROSE 1-4 GM/50ML-% IV SOLN
INTRAVENOUS | Status: AC
  Filled 2019-06-21: qty 50

## 2019-06-21 MED ORDER — LIDOCAINE 2% (20 MG/ML) 5 ML SYRINGE
INTRAMUSCULAR | Status: DC | PRN
  Administered 2019-06-21: 100 mg via INTRAVENOUS

## 2019-06-21 MED ORDER — LACTATED RINGERS IV SOLN
INTRAVENOUS | Status: DC
  Filled 2019-06-21: qty 1000

## 2019-06-21 MED ORDER — FENTANYL CITRATE (PF) 100 MCG/2ML IJ SOLN
INTRAMUSCULAR | Status: DC | PRN
  Administered 2019-06-21 (×2): 50 ug via INTRAVENOUS

## 2019-06-21 MED ORDER — MIDAZOLAM HCL 2 MG/2ML IJ SOLN
INTRAMUSCULAR | Status: AC
  Filled 2019-06-21: qty 2

## 2019-06-21 MED ORDER — ACETAMINOPHEN 500 MG PO TABS
1000.0000 mg | ORAL_TABLET | Freq: Once | ORAL | Status: AC
  Administered 2019-06-21: 1000 mg via ORAL
  Filled 2019-06-21: qty 2

## 2019-06-21 MED ORDER — PROPOFOL 500 MG/50ML IV EMUL
INTRAVENOUS | Status: AC
  Filled 2019-06-21: qty 100

## 2019-06-21 MED ORDER — ACETAMINOPHEN 500 MG PO TABS: 1000.0000 mg | ORAL_TABLET | Freq: Three times a day (TID) | ORAL | 0 refills | Status: AC

## 2019-06-21 MED ORDER — ENSURE PRE-SURGERY PO LIQD
296.0000 mL | Freq: Once | ORAL | Status: DC
  Filled 2019-06-21: qty 296

## 2019-06-21 MED ORDER — LIDOCAINE 2% (20 MG/ML) 5 ML SYRINGE
INTRAMUSCULAR | Status: AC
  Filled 2019-06-21: qty 5

## 2019-06-21 MED ORDER — ACETAMINOPHEN 500 MG PO TABS
ORAL_TABLET | ORAL | Status: AC
  Filled 2019-06-21: qty 2

## 2019-06-21 MED ORDER — GABAPENTIN 100 MG PO CAPS
ORAL_CAPSULE | ORAL | Status: AC
  Filled 2019-06-21: qty 1

## 2019-06-21 MED ORDER — ACETAMINOPHEN 500 MG PO TABS
1000.0000 mg | ORAL_TABLET | ORAL | Status: DC
  Filled 2019-06-21: qty 2

## 2019-06-21 MED ORDER — SODIUM CHLORIDE 0.9 % IR SOLN
Status: DC | PRN
  Administered 2019-06-21: 3000 mL

## 2019-06-21 MED ORDER — BUPIVACAINE-EPINEPHRINE 0.25% -1:200000 IJ SOLN
INTRAMUSCULAR | Status: DC | PRN
  Administered 2019-06-21: 30 mL

## 2019-06-21 MED ORDER — GABAPENTIN 100 MG PO CAPS
100.0000 mg | ORAL_CAPSULE | ORAL | Status: AC
  Administered 2019-06-21: 06:00:00 100 mg via ORAL
  Filled 2019-06-21: qty 1

## 2019-06-21 SURGICAL SUPPLY — 47 items
ADH SKN CLS APL DERMABOND .7 (GAUZE/BANDAGES/DRESSINGS) ×1
APL PRP STRL LF DISP 70% ISPRP (MISCELLANEOUS) ×1
APL SKNCLS STERI-STRIP NONHPOA (GAUZE/BANDAGES/DRESSINGS)
APPLIER CLIP 5 13 M/L LIGAMAX5 (MISCELLANEOUS) ×2
APR CLP MED LRG 5 ANG JAW (MISCELLANEOUS) ×1
BANDAGE ADH SHEER 1  50/CT (GAUZE/BANDAGES/DRESSINGS) IMPLANT
BENZOIN TINCTURE PRP APPL 2/3 (GAUZE/BANDAGES/DRESSINGS) ×1 IMPLANT
CABLE HIGH FREQUENCY MONO STRZ (ELECTRODE) ×2 IMPLANT
CHLORAPREP W/TINT 26 (MISCELLANEOUS) ×2 IMPLANT
CLIP APPLIE 5 13 M/L LIGAMAX5 (MISCELLANEOUS) IMPLANT
COVER SURGICAL LIGHT HANDLE (MISCELLANEOUS) ×2 IMPLANT
COVER WAND RF STERILE (DRAPES) ×2 IMPLANT
DECANTER SPIKE VIAL GLASS SM (MISCELLANEOUS) ×1 IMPLANT
DERMABOND ADVANCED (GAUZE/BANDAGES/DRESSINGS) ×1
DERMABOND ADVANCED .7 DNX12 (GAUZE/BANDAGES/DRESSINGS) ×1 IMPLANT
DEVICE SECURE STRAP 25 ABSORB (INSTRUMENTS) ×1 IMPLANT
DRSG COVADERM PLUS 2X2 (GAUZE/BANDAGES/DRESSINGS) IMPLANT
DRSG TEGADERM 2-3/8X2-3/4 SM (GAUZE/BANDAGES/DRESSINGS) IMPLANT
DRSG TEGADERM 4X4.75 (GAUZE/BANDAGES/DRESSINGS) IMPLANT
ELECT PENCIL ROCKER SW 15FT (MISCELLANEOUS) IMPLANT
ENDOLOOP SUT PDS II  0 18 (SUTURE) ×2
ENDOLOOP SUT PDS II 0 18 (SUTURE) IMPLANT
GAUZE SPONGE 2X2 8PLY STRL LF (GAUZE/BANDAGES/DRESSINGS) IMPLANT
GLOVE BIO SURGEON STRL SZ7.5 (GLOVE) ×2 IMPLANT
GLOVE INDICATOR 8.0 STRL GRN (GLOVE) ×2 IMPLANT
GOWN STRL REUS W/TWL XL LVL3 (GOWN DISPOSABLE) ×6 IMPLANT
GRASPER SUT TROCAR 14GX15 (MISCELLANEOUS) ×1 IMPLANT
L-HOOK LAP DISP 36CM (ELECTROSURGICAL)
LHOOK LAP DISP 36CM (ELECTROSURGICAL) IMPLANT
MESH 3DMAX 4X6 RT LRG (Mesh General) ×2 IMPLANT
PACK BASIN DAY SURGERY FS (CUSTOM PROCEDURE TRAY) ×2 IMPLANT
SCISSORS LAP 5X35 DISP (ENDOMECHANICALS) ×2 IMPLANT
SET IRRIG TUBING LAPAROSCOPIC (IRRIGATION / IRRIGATOR) IMPLANT
SET SUCTION IRRIG HYDROSURG (IRRIGATION / IRRIGATOR) ×1 IMPLANT
SET TUBE SMOKE EVAC HIGH FLOW (TUBING) ×2 IMPLANT
SPONGE GAUZE 2X2 STER 10/PKG (GAUZE/BANDAGES/DRESSINGS)
STRIP CLOSURE SKIN 1/2X4 (GAUZE/BANDAGES/DRESSINGS) ×1 IMPLANT
SUT MNCRL AB 4-0 PS2 18 (SUTURE) ×3 IMPLANT
SUT NOVA NAB DX-16 0-1 5-0 T12 (SUTURE) IMPLANT
SUT NOVA NAB GS-21 0 18 T12 DT (SUTURE) IMPLANT
SUT VICRYL 0 UR6 27IN ABS (SUTURE) ×1 IMPLANT
TOWEL OR 17X26 10 PK STRL BLUE (TOWEL DISPOSABLE) ×2 IMPLANT
TRAY FOL W/BAG SLVR 16FR STRL (SET/KITS/TRAYS/PACK) IMPLANT
TRAY FOLEY W/BAG SLVR 16FR LF (SET/KITS/TRAYS/PACK) ×2
TRAY LAPAROSCOPIC (CUSTOM PROCEDURE TRAY) ×2 IMPLANT
TROCAR BLADELESS OPT 5 100 (ENDOMECHANICALS) ×4 IMPLANT
TROCAR XCEL BLUNT TIP 100MML (ENDOMECHANICALS) ×2 IMPLANT

## 2019-06-21 NOTE — Transfer of Care (Signed)
Immediate Anesthesia Transfer of Care Note  Patient: Dillon Rios.  Procedure(s) Performed: LAPAROSCOPIC REPAIR OF RECURRENT RIGHT INGUINAL HERNIA WITH MESH (Right Abdomen)  Patient Location: PACU  Anesthesia Type:General  Level of Consciousness: awake, alert , oriented and patient cooperative  Airway & Oxygen Therapy: Patient Spontanous Breathing and Patient connected to nasal cannula oxygen  Post-op Assessment: Report given to RN and Post -op Vital signs reviewed and stable  Post vital signs: Reviewed and stable  Last Vitals:  Vitals Value Taken Time  BP    Temp    Pulse 84 06/21/19 1010  Resp 19 06/21/19 1010  SpO2 99 % 06/21/19 1010  Vitals shown include unvalidated device data.  Last Pain:  Vitals:   06/21/19 0545  TempSrc: Oral         Complications: No apparent anesthesia complications

## 2019-06-21 NOTE — Discharge Instructions (Signed)
Post Anesthesia Home Care Instructions  Activity: Get plenty of rest for the remainder of the day. A responsible adult should stay with you for 24 hours following the procedure.  For the next 24 hours, DO NOT: -Drive a car -Paediatric nurse -Drink alcoholic beverages -Take any medication unless instructed by your physician -Make any legal decisions or sign important papers.  Meals: Start with liquid foods such as gelatin or soup. Progress to regular foods as tolerated. Avoid greasy, spicy, heavy foods. If nausea and/or vomiting occur, drink only clear liquids until the nausea and/or vomiting subsides. Call your physician if vomiting continues.  Special Instructions/Symptoms: Your throat may feel dry or sore from the anesthesia or the breathing tube placed in your throat during surgery. If this causes discomfort, gargle with warm salt water. The discomfort should disappear within 24 hours.  If you had a scopolamine patch placed behind your ear for the management of post- operative nausea and/or vomiting:  1. The medication in the patch is effective for 72 hours, after which it should be removed.  Wrap patch in a tissue and discard in the trash. Wash hands thoroughly with soap and water. 2. You may remove the patch earlier than 72 hours if you experience unpleasant side effects which may include dry mouth, dizziness or visual disturbances. 3. Avoid touching the patch. Wash your hands with soap and water after contact with the patch.   RESUME PLAVIX ON Tuesday MARCH 30  [facility], [place]  UMBILICAL OR INGUINAL HERNIA REPAIR: POST OP INSTRUCTIONS  Always review your discharge instruction sheet given to you by the facility where your surgery was performed. IF YOU HAVE DISABILITY OR FAMILY LEAVE FORMS, YOU MUST BRING THEM TO THE OFFICE FOR PROCESSING.   DO NOT GIVE THEM TO YOUR DOCTOR.  1. A  prescription for pain medication may be given to you upon discharge.  Take your  pain medication as prescribed, if needed.  If narcotic pain medicine is not needed, then you may take acetaminophen (Tylenol) or ibuprofen (Advil) as needed. 2. Take your usually prescribed medications unless otherwise directed. 3. If you need a refill on your pain medication, please contact your pharmacy.  They will contact our office to request authorization. Prescriptions will not be filled after 5 pm or on week-ends. 4. You should follow a light diet the first 24 hours after arrival home, such as soup and crackers, etc.  Be sure to include lots of fluids daily.  Resume your normal diet the day after surgery. 5. Most patients will experience some swelling and bruising around the umbilicus or in the groin and scrotum.  Ice packs and reclining will help.  Swelling and bruising can take several days to resolve.  6. It is common to experience some constipation if taking pain medication after surgery.  Increasing fluid intake and taking a stool softener (such as Colace) will usually help or prevent this problem from occurring.  A mild laxative (Milk of Magnesia or Miralax) should be taken according to package directions if there are no bowel movements after 48 hours. 7. Unless discharge instructions indicate otherwise, you may remove your bandages 24-48 hours after surgery, and you may shower at that time.  You may have steri-strips (small skin tapes) in place directly over the incision.  These strips should be left on the skin for 7-10 days.  If your surgeon used skin glue on the incision, you may shower in 24 hours.  The glue will flake off  over the next 2-3 weeks.  Any sutures or staples will be removed at the office during your follow-up visit. 8. ACTIVITIES:  You may resume regular (light) daily activities beginning the next day--such as daily self-care, walking, climbing stairs--gradually increasing activities as tolerated.  You may have sexual intercourse when it is comfortable.  Refrain from any heavy  lifting or straining until approved by your doctor. a. You may drive when you are no longer taking prescription pain medication, you can comfortably wear a seatbelt, and you can safely maneuver your car and apply brakes. b. RETURN TO WORK:  9. You should see your doctor in the office for a follow-up appointment approximately 2-3 weeks after your surgery.  Make sure that you call for this appointment within a day or two after you arrive home to insure a convenient appointment time. 10. OTHER INSTRUCTIONS: DO NOT LIFT/PUSH/PULL ANYTHING GREATER THAN 10 LBS FOR 6 WEEKS    WHEN TO CALL YOUR DOCTOR: 1. Fever over 101.0 2. Inability to urinate 3. Nausea and/or vomiting 4. Extreme swelling or bruising 5. Continued bleeding from incision. 6. Increased pain, redness, or drainage from the incision  The clinic staff is available to answer your questions during regular business hours.  Please don't hesitate to call and ask to speak to one of the nurses for clinical concerns.  If you have a medical emergency, go to the nearest emergency room or call 911.  A surgeon from [facility] is always on call at the hospital   [address], [place], [place], [place]  [address] ?  [address], [place], [place]   [phone] ? [phone] ? FAX [phone] Web site: [email or web address]   ........Marland Kitchen

## 2019-06-21 NOTE — Anesthesia Procedure Notes (Signed)
Procedure Name: Intubation Date/Time: 06/21/2019 7:56 AM Performed by: Wanita Chamberlain, CRNA Pre-anesthesia Checklist: Patient identified, Emergency Drugs available, Suction available and Patient being monitored Patient Re-evaluated:Patient Re-evaluated prior to induction Preoxygenation: Pre-oxygenation with 100% oxygen Induction Type: IV induction Ventilation: Mask ventilation without difficulty Laryngoscope Size: Mac and 4 Grade View: Grade II Tube type: Oral Tube size: 7.5 mm Number of attempts: 1 Airway Equipment and Method: Stylet Placement Confirmation: breath sounds checked- equal and bilateral,  CO2 detector,  positive ETCO2 and ETT inserted through vocal cords under direct vision Secured at: 22 cm Tube secured with: Tape Dental Injury: Teeth and Oropharynx as per pre-operative assessment

## 2019-06-21 NOTE — Op Note (Signed)
06/21/2019  Dillon Rios. [birth year]   PREOPERATIVE DIAGNOSIS: recurrent right inguinal hernia.   POSTOPERATIVE DIAGNOSIS: recurrent right indirect inguinal hernia.   PROCEDURE: Laparoscopic repair of recurrent right indirect inguinal hernia with  mesh (TAPP).   SURGEON: Leighton Ruff. Redmond Pulling, MD   ASSISTANT SURGEON: None.   ANESTHESIA: General plus local consisting of 0.25% Marcaine with epi.   ESTIMATED BLOOD LOSS: Minimal.   FINDINGS: The patient had a recurrent right indirect inguinal hernia.  It was repaired using a bard large 3dmax mesh right. Early beginning left direct hernia probably SPECIMEN: none  INDICATIONS FOR PROCEDURE: 74 year old male presented for elective repair of recurrent right inguinal hernia with mesh.  He had a prior open repair over 20 years ago. The risks and benefits including but not limited to bleeding, infection, chronic inguinal pain, nerve entrapment, hernia recurrence, mesh complications, hematoma formation, urinary retention, injury to the testicles or the ovaries, numbness in the groin, blood clots, injury to the surrounding structures, and anesthesia risk was discussed with the patient.  DESCRIPTION OF PROCEDURE: After obtaining verbal consent and marking  the right groin in the holding area with the patient confirming the  operative site, the patient was then taken back to the operating room, placed  supine on the operating room table. General endotracheal anesthesia was  established. The patient had emptied their bladder prior to going back to  the operating room. Sequential compression devices were placed. The  abdomen and groin were prepped and draped in the usual standard surgical  fashion with ChloraPrep. The patient received oral Tylenol as well as IV  antibiotics prior to the incision. A surgical time-out was performed.  Local was infiltrated at the base of the umbilicus.   Next, a 1-cm vertical infraumbilical incision was made with a  #11 blade. The fascia  was grasped and lifted anteriorly. Next, the fascia was incised, and  the abdominal cavity was entered. Pursestring suture was placed around  the fascial edges using a 0 Vicryl. A 12-mm Hasson trocar was placed.  Pneumoperitoneum was smoothly established up to a patient pressure of 15  mmHg. Laparoscope was advanced. There was some evidence of an early left direct  contralateral hernia. The patient had a defect lateral to  the inferior epigastric vessel, consistent with an recurrent  Right indirect  hernia. Two 5-mm trocars were placed, one on the right, one on the left  in the midclavicular line slightly above the level of the umbilicus all  under direct visualization.  It appeared that he had a thickened area of peritoneum coming from medially going down into the inguinal canal.  It appeared as if he had a herniated median umbilical ligament down into the inguinal canal.  After local had been infiltrated, I then  made incision along the peritoneum on the right, starting 2 inches above  the anterior superior iliac spine and caring it medial  toward the median umbilical ligament in a lazy S configuration using  Endo Shears with electrocautery. The peritoneal flap was then gently  dissected downward from the anterior abdominal wall taking care not to  injure the inferior epigastric vessels. The pubic bone was identified.  The herniated peritoneum down into the canal was densely adherent to the cord structures and at the orifice of the inguinal canal.  It took an extra 30 minutes or so to reduce this and to mobilize it then typical fashion. The testicular vessels were identified.  Using  traction and counter traction with short  graspers, I reduced the sac in  its entirety. The testicular vessels had been identified and preserved. The vas deferens was identified and preserved, and the hernia sac was stripped from those to  surrounding structures. I then went about creating a  large pocket by  lifting the peritoneum of the pelvic floor. I took great care not to  injure the iliac vessels.  Because there was this thickened area peritoneum coming from medial down into the inguinal canal that I had reduced I did not think it was consistent with a herniated portion of the bladder.  However I wanted to check the bladder to make sure that there is no bladder injury.  We had the circulating nurse place a Foley catheter sterilely.  We then instilled the bladder with 300 cc of saline with methylene blue.  The bladder field when viewed laparoscopically.  There is no evidence of leakage.  My area of dissection was completely anterior to the bladder.  Some of the bladder was decompressed.   Local anesthetic was injected 2 finger breadths below and medial to the anterior superior iliac spine as well as along the right groin prior to placing the mesh. I then obtained a large piece of bard 3d max mesh, placed it through the Hasson trocar, half of it covered medial  to the inferior epigastric vessels and half of it lateral to the  inferior epigastric vessels. The defect was well  covered with the mesh. I then secured the mesh to the abdominal wall  using an Ethicon secure strap tack. Tacks were placed through  the Cooper's ligament, one tack on each side of the inferior epigastric  vessel and 1 tacks out laterally (total of 3 tacks). No tacks were placed below the  shelving edge of the inguinal ligament. Pneumoperitoneum was reduced  to 8 mmHg. I then brought the peritoneal flap back up to the abdominal  wall and tacked it to the abdominal wall using 4 tacks. There was a  defect in the peritoneum.  Since there was a defect in the peritoneal flap I was able to close it by using a PDS Endoloop.  At this point the mesh was well covered and there was no exposed mesh.. I removed the  Hasson trocar and tied down the previously placed pursestring suture.  The closure was viewed laparoscopically.   I placed an additional suture at the umbilical fascia using a PMI suture passer and 0 Vicryl with laparoscopic assistance.  There was no evidence of  fascial defect. There was no air leak at the umbilicus. There was no  evidence of injury to surrounding structures. Pneumoperitoneum was  released, and the remaining trocars were removed. All skin incisions  were closed with a 4-0 Monocryl in a subcuticular fashion followed by  application of Dermabond. All needle, instrument, and sponge counts  were correct x2. There are no immediate complications. The patient  tolerated the procedure well. The patient was extubated and taken to the  recovery room in stable condition.  Leighton Ruff. Redmond Pulling, MD, FACS General, Bariatric, & Minimally Invasive Surgery [facility], [place]

## 2019-06-21 NOTE — Anesthesia Postprocedure Evaluation (Signed)
Anesthesia Post Note  Patient: Dillon Rios.  Procedure(s) Performed: LAPAROSCOPIC REPAIR OF RECURRENT RIGHT INGUINAL HERNIA WITH MESH (Right Abdomen)     Patient location during evaluation: PACU Anesthesia Type: General Level of consciousness: awake and alert and oriented Pain management: pain level controlled Vital Signs Assessment: post-procedure vital signs reviewed and stable Respiratory status: spontaneous breathing, nonlabored ventilation and respiratory function stable Cardiovascular status: blood pressure returned to baseline Postop Assessment: no apparent nausea or vomiting Anesthetic complications: no    Last Vitals:  Vitals:   06/21/19 1110 06/21/19 1115  BP:  137/69  Pulse:  67  Resp:  16  Temp:    SpO2: 98% 99%    Last Pain:  Vitals:   06/21/19 1028  TempSrc: Oral                 Brennan Bailey

## 2019-06-21 NOTE — H&P (Signed)
Dillon Rios. is an 74 y.o. male.   Chief Complaint: here for surgery  HPI: 74 yo male presents for repair of recurrent RIH. Denies any changes since seen in clinic late jan. Stopped plavix 5 days ago.   The patient is a 74 year old male who presents with an inguinal hernia. He comes in to discuss right-sided groin pain. He saw one of my partners about a week and a half ago and was diagnosed with a recurrent right inguinal hernia. He was sent to me to discuss the laparoscopic approach. I reviewed Dr. Orest Dikes note from January 12. He states that he underwent an open repair of a right inguinal hernia in the mid 2000s. Mesh was used and he thinks it was a plug mesh. He states the incision has always been puffy but a few months ago he noticed a bulge in his right groin. It causes him pain at times. He said one or 2 episodes of severe pain which felt like a kidney stone. He denies any burning, shooting or stabbing pain. He generally has a bowel movement at least every other day to generally daily. He does complain of some increased flatulence. He denies any significant BPH symptoms. He denies any melena or hematochezia. He had a colonoscopy in 2018. He is on Plavix. He has had stents placed in his coronary arteries in the mid to thousands for coronary artery disease. He denies any chest pain, chest pressure, source of breath, dyspnea on exertion or orthopnea. We have received cardiac clearance for surgery.   Past Medical History:  Diagnosis Date  . Abnormal EKG    left anterio fasicula rblock on 11-05-15 ekg and 10-28-14 ekg epic  . Arthritis   . Carpal tunnel syndrome    Bilateral  . Coronary artery disease 10/26/2004   drug eluting stent placement to the mild LAD and RCA  2006/ PCI of LAD for restenosis 02/24/2006-Dr Turner  . Headache   . Heart murmur 11/05/2015  . History of kidney stones   . Hyperlipidemia   . Right inguinal hernia   . Skin cancer    right scalp Dr Wilhemina Bonito   . Wears glasses     Past Surgical History:  Procedure Laterality Date  . APPENDECTOMY     age 2  . CARDIAC CATHETERIZATION    . COLONOSCOPY WITH PROPOFOL N/A 07/04/2016   Procedure: COLONOSCOPY WITH PROPOFOL;  Surgeon: Garlan Fair, MD;  Location: [facility];  Service: Endoscopy;  Laterality: N/A;  . CORONARY STENT PLACEMENT  2006 and 2007  . CYSTOSCOPY WITH RETROGRADE PYELOGRAM, URETEROSCOPY AND STENT PLACEMENT Left 06/11/2015   Procedure: CYSTOSCOPY WITH RETROGRADE PYELOGRAM, URETEROSCOPY AND BASKET STONE EXTRACTION STENT PLACEMENT;  Surgeon: Alexis Frock, MD;  Location: [facility];  Service: Urology;  Laterality: Left;  . HERNIA REPAIR  2003  . 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 MOUTH SURGERY    . TONSILLECTOMY     age 23    Family History  Problem Relation Age of Onset  . CAD Mother   . Heart attack Father   . Breast cancer Sister    Social History:  reports that he quit smoking about 41 years ago. His smoking use included cigarettes. He has a 10.00 pack-year smoking history. He has never used smokeless tobacco. He reports current alcohol use. He reports that he does not use drugs.  Allergies:  Allergies  Allergen Reactions  . [place] [Viscum Album] Hives  Medications Prior to Admission  Medication Sig Dispense Refill  . acetaminophen (TYLENOL) 500 MG tablet Take 500 mg by mouth every 6 (six) hours as needed.    . Misc Natural Products (TART CHERRY ADVANCED) CAPS Take 1 capsule by mouth daily.     . Multiple Vitamin (MULTIVITAMIN) tablet Take 1 tablet by mouth daily.    Marland Kitchen amLODipine (NORVASC) 5 MG tablet Take 1 tablet (5 mg total) by mouth daily. (Patient taking differently: Take 5 mg by mouth as needed. As need for BP reading greater than 150/80) 30 tablet 0  . aspirin EC 81 MG tablet Take 1 tablet (81 mg total) by mouth daily.    Marland Kitchen atorvastatin (LIPITOR) 80 MG tablet TAKE 1 TABLET BY MOUTH ONCE DAILY (Patient taking differently: every  evening. ) 90 tablet 3  . clopidogrel (PLAVIX) 75 MG tablet Take 75 mg by mouth daily.    . diclofenac sodium (VOLTAREN) 1 % GEL Apply 2 g topically daily as needed (pain).     . Liniments (BLUE-EMU SUPER STRENGTH) CREA Apply 1 application topically daily as needed (arthritis).    . naproxen sodium (ANAPROX) 220 MG tablet Take 440 mg by mouth daily as needed (pain).    . nitroGLYCERIN (NITROSTAT) 0.4 MG SL tablet Place 0.4 mg under the tongue every 5 (five) minutes as needed for chest pain.    . Omega-3 Fatty Acids (FISH OIL) 1200 MG CAPS Take 1,200 mg by mouth daily.     . Triamcinolone Acetonide (NASACORT AQ NA) Place 1 spray into the nose daily as needed (congestion).      Results for orders placed or performed during the hospital encounter of 06/21/19 (from the past 48 hour(s))  Basic metabolic panel     Status: Abnormal   Collection Time: 06/21/19  6:25 AM  Result Value Ref Range   Sodium 138 135 - 145 mmol/L   Potassium 4.1 3.5 - 5.1 mmol/L   Chloride 107 98 - 111 mmol/L   CO2 23 22 - 32 mmol/L   Glucose, Bld 112 (H) 70 - 99 mg/dL    Comment: Glucose reference range applies only to samples taken after fasting for at least 8 hours.   BUN 29 (H) 8 - 23 mg/dL   Creatinine, Ser 0.96 0.61 - 1.24 mg/dL   Calcium 9.0 8.9 - 10.3 mg/dL   GFR calc non Af Amer >60 >60 mL/min   GFR calc Af Amer >60 >60 mL/min   Anion gap 8 5 - 15    Comment: Performed at [facility], [place] [address], [place], [place] [address]  CBC WITH DIFFERENTIAL     Status: Abnormal   Collection Time: 06/21/19  6:25 AM  Result Value Ref Range   WBC 5.2 4.0 - 10.5 K/uL   RBC 4.18 (L) 4.22 - 5.81 MIL/uL   Hemoglobin 14.0 13.0 - 17.0 g/dL   HCT 42.2 39.0 - 52.0 %   MCV 101.0 (H) 80.0 - 100.0 fL   MCH 33.5 26.0 - 34.0 pg   MCHC 33.2 30.0 - 36.0 g/dL   RDW 13.6 11.5 - 15.5 %   Platelets 179 150 - 400 K/uL   nRBC 0.0 0.0 - 0.2 %   Neutrophils Relative % 51 %   Neutro Abs 2.7 1.7 - 7.7 K/uL    Lymphocytes Relative 35 %   Lymphs Abs 1.8 0.7 - 4.0 K/uL   Monocytes Relative 7 %   Monocytes Absolute 0.4 0.1 - 1.0 K/uL  Eosinophils Relative 6 %   Eosinophils Absolute 0.3 0.0 - 0.5 K/uL   Basophils Relative 1 %   Basophils Absolute 0.0 0.0 - 0.1 K/uL   Immature Granulocytes 0 %   Abs Immature Granulocytes 0.01 0.00 - 0.07 K/uL    Comment: Performed at [facility], [place] [address] [place], [place] [address]   No results found.  Review of Systems  All other systems reviewed and are negative.   Blood pressure (!) 154/67, pulse 65, temperature 98.1 F (36.7 C), temperature source Oral, resp. rate 18, height 5\' 6"  (1.676 m), weight 70.1 kg, SpO2 98 %. Physical Exam  Vitals reviewed. Constitutional: He is oriented to person, place, and time. He appears well-developed and well-nourished. He is cooperative. No distress.  HENT:  Head: Normocephalic and atraumatic. Head is without raccoon's eyes, without Battle's sign, without abrasion, without contusion and without laceration.  Right Ear: Hearing and external ear normal. No lacerations. No drainage or tenderness. No foreign bodies.  Left Ear: Hearing normal. No lacerations. No drainage or tenderness. No foreign bodies.  Nose: Nose normal. No nose lacerations, sinus tenderness, nasal deformity or nasal septal hematoma. No epistaxis.  Mouth/Throat: Uvula is midline, oropharynx is clear and moist and mucous membranes are normal. No lacerations.  Eyes: Conjunctivae and lids are normal. No scleral icterus.  Neck: Trachea normal. No tracheal deviation present. No thyromegaly present.  Cardiovascular: Normal rate, regular rhythm, normal heart sounds, intact distal pulses and normal pulses.  Respiratory: Effort normal and breath sounds normal. No stridor. No respiratory distress. He has no wheezes. He exhibits no tenderness, no bony tenderness, no laceration and no crepitus.  GI: Soft. Normal appearance. He exhibits no  distension. Bowel sounds are decreased. There is no abdominal tenderness. There is no rigidity, no rebound, no guarding and no CVA tenderness. A hernia is present. Hernia confirmed positive in the right inguinal area.  Genitourinary:    Genitourinary Comments: Old right inguinal scar   Musculoskeletal:        General: No tenderness or edema. Normal range of motion.     Cervical back: Normal range of motion and neck supple.  Lymphadenopathy:    He has no cervical adenopathy.  Neurological: He is alert and oriented to person, place, and time. He has normal strength. No cranial nerve deficit or sensory deficit. He exhibits normal muscle tone. GCS eye subscore is 4. GCS verbal subscore is 5. GCS motor subscore is 6.  Skin: Skin is warm, dry and intact. No rash noted. He is not diaphoretic. No erythema. No pallor.  Psychiatric: He has a normal mood and affect. His speech is normal and behavior is normal. Judgment and thought content normal.     Assessment/Plan Recurrent right inguinal hernia CAD HTN On antiplatelet -held  To OR for lap repair of recurrent RIGht Ing hernia ERAS protocol IV abx All questions asked and answered  Leighton Ruff. Redmond Pulling, MD, FACS General, Bariatric, & Minimally Invasive Surgery [facility], [place]   Greer Pickerel, MD 06/21/2019, 7:37 AM

## 2020-02-11 ENCOUNTER — Ambulatory Visit (INDEPENDENT_AMBULATORY_CARE_PROVIDER_SITE_OTHER): Payer: Medicare Other | Admitting: Cardiology

## 2020-02-11 ENCOUNTER — Other Ambulatory Visit: Payer: Self-pay

## 2020-02-11 VITALS — BP 140/80 | HR 69 | Ht 66.0 in | Wt 159.0 lb

## 2020-02-11 DIAGNOSIS — E782 Mixed hyperlipidemia: Secondary | ICD-10-CM

## 2020-02-11 DIAGNOSIS — I2510 Atherosclerotic heart disease of native coronary artery without angina pectoris: Secondary | ICD-10-CM

## 2020-02-11 DIAGNOSIS — R011 Cardiac murmur, unspecified: Secondary | ICD-10-CM | POA: Diagnosis not present

## 2020-02-11 DIAGNOSIS — R030 Elevated blood-pressure reading, without diagnosis of hypertension: Secondary | ICD-10-CM

## 2020-02-11 NOTE — Addendum Note (Signed)
Addended by: Antonieta Iba on: 02/11/2020 08:52 AM   Modules accepted: Orders

## 2020-02-11 NOTE — Progress Notes (Addendum)
Cardiology Office Note:    Date:  02/11/2020   ID:  Dillon Rios., DOB 1945-04-08, MRN [id number]  PCP:  Cari Caraway, MD  Cardiologist:  Fransico Him, MD    Referring MD: Cari Caraway, MD   Chief Complaint  Patient presents with  . Coronary Artery Disease  . Hyperlipidemia    History of Present Illness:    Dillon Rios. is a 74 y.o. male with a hx of  ASCAD with PCI of LAD and RCA in 2006 and then againforLAD restenosis in 2007 He also has a history ofdyslipidemia and mild AS by echo 12/2018.  He is here today for followup and is doing well.  He denies any chest pain or pressure, SOB, DOE, PND, orthopnea, LE edema, dizziness, palpitations or syncope. He is compliant with his meds and is tolerating meds with no SE.    Past Medical History:  Diagnosis Date  . Abnormal EKG    left anterio fasicula rblock on 11-05-15 ekg and 10-28-14 ekg epic  . Arthritis   . Carpal tunnel syndrome    Bilateral  . Coronary artery disease 10/26/2004   drug eluting stent placement to the mild LAD and RCA  2006/ PCI of LAD for restenosis 02/24/2006-Dr Zenaida Tesar  . Headache   . Heart murmur 11/05/2015  . History of kidney stones   . Hyperlipidemia   . Right inguinal hernia   . Skin cancer    right scalp Dr Wilhemina Bonito  . Wears glasses     Past Surgical History:  Procedure Laterality Date  . APPENDECTOMY     age 20  . CARDIAC CATHETERIZATION    . COLONOSCOPY WITH PROPOFOL N/A 07/04/2016   Procedure: COLONOSCOPY WITH PROPOFOL;  Surgeon: Garlan Fair, MD;  Location: [facility];  Service: Endoscopy;  Laterality: N/A;  . CORONARY STENT PLACEMENT  2006 and 2007  . CYSTOSCOPY WITH RETROGRADE PYELOGRAM, URETEROSCOPY AND STENT PLACEMENT Left 06/11/2015   Procedure: CYSTOSCOPY WITH RETROGRADE PYELOGRAM, URETEROSCOPY AND BASKET STONE EXTRACTION STENT PLACEMENT;  Surgeon: Alexis Frock, MD;  Location: [facility];  Service: Urology;  Laterality: Left;  . HERNIA REPAIR  2003  . INGUINAL HERNIA  REPAIR Right 06/21/2019   Procedure: LAPAROSCOPIC REPAIR OF RECURRENT RIGHT INGUINAL HERNIA WITH MESH;  Surgeon: Greer Pickerel, MD;  Location: [facility];  Service: General;  Laterality: Right;  . melanoma removal     from left forearm-Dr [facility]  . MOUTH SURGERY    . TONSILLECTOMY     age 58    Current Medications: Current Meds  Medication Sig  . amLODipine (NORVASC) 5 MG tablet Take 1 tablet (5 mg total) by mouth daily. (Patient taking differently: Take 5 mg by mouth as needed. As need for BP reading greater than 150/80)  . aspirin EC 81 MG tablet Take 1 tablet (81 mg total) by mouth daily.  Marland Kitchen atorvastatin (LIPITOR) 80 MG tablet TAKE 1 TABLET BY MOUTH ONCE DAILY (Patient taking differently: every evening. )  . clopidogrel (PLAVIX) 75 MG tablet Take 75 mg by mouth daily.  . diclofenac sodium (VOLTAREN) 1 % GEL Apply 2 g topically daily as needed (pain).   . Liniments (BLUE-EMU SUPER STRENGTH) CREA Apply 1 application topically daily as needed (arthritis).  . Misc Natural Products (TART CHERRY ADVANCED) CAPS Take 1 capsule by mouth daily.   . Multiple Vitamin (MULTIVITAMIN) tablet Take 1 tablet by mouth daily.  . naproxen sodium (ANAPROX) 220 MG tablet Take 440 mg by  mouth daily as needed (pain).  . nitroGLYCERIN (NITROSTAT) 0.4 MG SL tablet Place 0.4 mg under the tongue every 5 (five) minutes as needed for chest pain.  . Omega-3 Fatty Acids (FISH OIL) 1200 MG CAPS Take 1,200 mg by mouth daily.   Marland Kitchen oxyCODONE (OXY IR/ROXICODONE) 5 MG immediate release tablet Take 1 tablet (5 mg total) by mouth every 6 (six) hours as needed for severe pain.  . Triamcinolone Acetonide (NASACORT AQ NA) Place 1 spray into the nose daily as needed (congestion).     Allergies:   Mistletoe [viscum album]   Social History   Socioeconomic History  . Marital status: Married    Spouse name: Not on file  . Number of children: Not on file  . Years of education: Not on file  . Highest  education level: Not on file  Occupational History  . Not on file  Tobacco Use  . Smoking status: Former Smoker    Packs/day: 1.00    Years: 10.00    Pack years: 10.00    Types: Cigarettes    Quit date: 03/28/1978    Years since quitting: 41.9  . Smokeless tobacco: Never Used  Vaping Use  . Vaping Use: Never used  Substance and Sexual Activity  . Alcohol use: Yes    Comment: rare  . Drug use: No  . Sexual activity: Not on file  Other Topics Concern  . Not on file  Social History Narrative  . Not on file   Social Determinants of Health   Financial Resource Strain:   . Difficulty of Paying Living Expenses: Not on file  Food Insecurity:   . Worried About Charity fundraiser in the Last Year: Not on file  . Ran Out of Food in the Last Year: Not on file  Transportation Needs:   . Lack of Transportation (Medical): Not on file  . Lack of Transportation (Non-Medical): Not on file  Physical Activity:   . Days of Exercise per Week: Not on file  . Minutes of Exercise per Session: Not on file  Stress:   . Feeling of Stress : Not on file  Social Connections:   . Frequency of Communication with Friends and Family: Not on file  . Frequency of Social Gatherings with Friends and Family: Not on file  . Attends Religious Services: Not on file  . Active Member of Clubs or Organizations: Not on file  . Attends Archivist Meetings: Not on file  . Marital Status: Not on file     Family History: The patient's family history includes Breast cancer in his sister; CAD in his mother; Heart attack in his father.  ROS:   Please see the history of present illness.    ROS  All other systems reviewed and negative.   EKGs/Labs/Other Studies Reviewed:    The following studies were reviewed today: none  EKG:  EKG is  ordered today.  The ekg ordered today demonstrates NSR with LAFB and no ST changes   Recent Labs: 06/21/2019: BUN 29; Creatinine, Ser 0.96; Hemoglobin 14.0; Platelets  179; Potassium 4.1; Sodium 138   Recent Lipid Panel    Component Value Date/Time   CHOL 117 10/23/2017 0852   TRIG 67 10/23/2017 0852   HDL 47 10/23/2017 0852   CHOLHDL 2.5 10/23/2017 0852   CHOLHDL 3 09/13/2013 0743   VLDL 18.2 09/13/2013 0743   LDLCALC 57 10/23/2017 0852    Physical Exam:    VS:  BP 140/80  Pulse 69   Ht 5\' 6"  (1.676 m)   Wt 159 lb (72.1 kg)   SpO2 97%   BMI 25.66 kg/m     Wt Readings from Last 3 Encounters:  02/11/20 159 lb (72.1 kg)  06/21/19 154 lb 9.6 oz (70.1 kg)  03/06/19 150 lb 9.2 oz (68.3 kg)     GEN: Well nourished, well developed in no acute distress HEENT: Normal NECK: No JVD; No carotid bruits LYMPHATICS: No lymphadenopathy CARDIAC:RRR, no rubs, gallops.  2/6 SM at RUSB RESPIRATORY:  Clear to auscultation without rales, wheezing or rhonchi  ABDOMEN: Soft, non-tender, non-distended MUSCULOSKELETAL:  No edema; No deformity  SKIN: Warm and dry NEUROLOGIC:  Alert and oriented x 3 PSYCHIATRIC:  Normal affect    ASSESSMENT:    1. Atherosclerosis of native coronary artery of native heart without angina pectoris   2. Mixed hyperlipidemia   3. Elevated blood pressure reading in office without diagnosis of hypertension   4. Heart murmur    PLAN:    In order of problems listed above:  1.  ASCAD -s/p PCI of LAD and RCA in 2006 and then againforLAD restenosis in 2007  -denies any anginal sx -continue ASA 81mg  daily, Plavix 75mg  daily and statin  2.  HLD -LDL goal < 70 -LDL 53 in March 2021 and ALT 28 in March 2021 -continue Atorvastatin 80mg  daily  3.  Elevated BP -BP borderline controlled -Controlled at home at 130/74mmHg -continue amlodipine 5mg  daily  4.  Heart murmur -echo 12/2018 showed trivial AR/TR/MR and mild AS -repeat echo 10/22  Medication Adjustments/Labs and Tests Ordered: Current medicines are reviewed at length with the patient today.  Concerns regarding medicines are outlined above.  Orders Placed This  Encounter  Procedures  . EKG 12-Lead   No orders of the defined types were placed in this encounter.   Signed, Fransico Him, MD  02/11/2020 8:44 AM    [place]

## 2020-02-11 NOTE — Patient Instructions (Signed)
Medication Instructions:  Your physician recommends that you continue on your current medications as directed. Please refer to the Current Medication list given to you today.  *If you need a refill on your cardiac medications before your next appointment, please call your pharmacy*  Testing/Procedures: Your physician has requested that you have an echocardiogram in October 2022. Echocardiography is a painless test that uses sound waves to create images of your heart. It provides your doctor with information about the size and shape of your heart and how well your heart's chambers and valves are working. This procedure takes approximately one hour. There are no restrictions for this procedure.  Follow-Up: At [facility], you and your health needs are our priority.  As part of our continuing mission to provide you with exceptional heart care, we have created designated Provider Care Teams.  These Care Teams include your primary Cardiologist (physician) and Advanced Practice Providers (APPs -  Physician Assistants and Nurse Practitioners) who all work together to provide you with the care you need, when you need it.  Your next appointment:   1 year(s)  The format for your next appointment:   In Person  Provider:   You may see Fransico Him, MD or one of the following Advanced Practice Providers on your designated Care Team:    Melina Copa, PA-C  Ermalinda Barrios, PA-C

## 2021-01-04 ENCOUNTER — Encounter: Payer: Self-pay | Admitting: Cardiology

## 2021-01-04 ENCOUNTER — Ambulatory Visit (HOSPITAL_COMMUNITY): Payer: Medicare Other | Attending: Cardiovascular Disease

## 2021-01-04 ENCOUNTER — Other Ambulatory Visit: Payer: Self-pay

## 2021-01-04 DIAGNOSIS — R011 Cardiac murmur, unspecified: Secondary | ICD-10-CM | POA: Diagnosis not present

## 2021-01-04 DIAGNOSIS — I2510 Atherosclerotic heart disease of native coronary artery without angina pectoris: Secondary | ICD-10-CM | POA: Diagnosis not present

## 2021-01-04 DIAGNOSIS — I350 Nonrheumatic aortic (valve) stenosis: Secondary | ICD-10-CM | POA: Insufficient documentation

## 2021-01-04 LAB — ECHOCARDIOGRAM COMPLETE
AR max vel: 1.03 cm2
AV Area VTI: 1.06 cm2
AV Area mean vel: 1.05 cm2
AV Mean grad: 13 mmHg
AV Peak grad: 23.8 mmHg
Ao pk vel: 2.44 m/s
Area-P 1/2: 2.87 cm2
P 1/2 time: 420 msec
S' Lateral: 3.1 cm

## 2021-02-05 ENCOUNTER — Ambulatory Visit: Payer: Medicare Other | Admitting: Cardiology

## 2021-03-05 ENCOUNTER — Ambulatory Visit: Payer: Medicare Other | Admitting: Cardiology

## 2021-06-03 ENCOUNTER — Encounter: Payer: Self-pay | Admitting: *Deleted

## 2021-06-03 ENCOUNTER — Encounter: Payer: Self-pay | Admitting: Cardiology

## 2021-06-03 ENCOUNTER — Ambulatory Visit (INDEPENDENT_AMBULATORY_CARE_PROVIDER_SITE_OTHER): Payer: Medicare Other | Admitting: Cardiology

## 2021-06-03 ENCOUNTER — Other Ambulatory Visit: Payer: Self-pay

## 2021-06-03 VITALS — BP 148/86 | HR 70 | Ht 66.0 in | Wt 160.0 lb

## 2021-06-03 DIAGNOSIS — I350 Nonrheumatic aortic (valve) stenosis: Secondary | ICD-10-CM

## 2021-06-03 DIAGNOSIS — I2510 Atherosclerotic heart disease of native coronary artery without angina pectoris: Secondary | ICD-10-CM | POA: Diagnosis not present

## 2021-06-03 DIAGNOSIS — G4733 Obstructive sleep apnea (adult) (pediatric): Secondary | ICD-10-CM

## 2021-06-03 DIAGNOSIS — R030 Elevated blood-pressure reading, without diagnosis of hypertension: Secondary | ICD-10-CM | POA: Diagnosis not present

## 2021-06-03 DIAGNOSIS — E782 Mixed hyperlipidemia: Secondary | ICD-10-CM | POA: Diagnosis not present

## 2021-06-03 LAB — LIPID PANEL
Chol/HDL Ratio: 2.2 ratio (ref 0.0–5.0)
Cholesterol, Total: 132 mg/dL (ref 100–199)
HDL: 61 mg/dL (ref >39)
LDL Chol Calc (NIH): 56 mg/dL (ref 0–99)
Triglycerides: 73 mg/dL (ref 0–149)
VLDL Cholesterol Cal: 15 mg/dL (ref 5–40)

## 2021-06-03 LAB — ALT: ALT: 17 IU/L (ref 0–44)

## 2021-06-03 NOTE — Telephone Encounter (Signed)
This encounter was created in error - please disregard.

## 2021-06-03 NOTE — Patient Instructions (Signed)
Medication Instructions:  ?Your physician recommends that you continue on your current medications as directed. Please refer to the Current Medication list given to you today. ? ?*If you need a refill on your cardiac medications before your next appointment, please call your pharmacy* ? ? ?Lab Work: ?Lipid and ALT today ? ?If you have labs (blood work) drawn today and your tests are completely normal, you will receive your results only by: ?MyChart Message (if you have MyChart) OR ?A paper copy in the mail ?If you have any lab test that is abnormal or we need to change your treatment, we will call you to review the results. ? ? ?Testing/Procedures: ?None ? ? ?Follow-Up: ?At [facility], you and your health needs are our priority.  As part of our continuing mission to provide you with exceptional heart care, we have created designated Provider Care Teams.  These Care Teams include your primary Cardiologist (physician) and Advanced Practice Providers (APPs -  Physician Assistants and Nurse Practitioners) who all work together to provide you with the care you need, when you need it. ? ?We recommend signing up for the patient portal called "MyChart".  Sign up information is provided on this After Visit Summary.  MyChart is used to connect with patients for Virtual Visits (Telemedicine).  Patients are able to view lab/test results, encounter notes, upcoming appointments, etc.  Non-urgent messages can be sent to your provider as well.   ?To learn more about what you can do with MyChart, go to [email or web address].   ? ?Your next appointment:   ?1 year(s) ? ?The format for your next appointment:   ?In Person ? ?Provider:   ?Fransico Him, MD   ? ? ?Other Instructions ?  ?

## 2021-06-03 NOTE — Progress Notes (Signed)
?Cardiology Office Note:   ? ?Date:  06/03/2021  ? ?ID:  [name], DOB [birth year], MRN [id number] ? ?PCP:  [facility]  ?Cardiologist:  Fransico Him, MD   ? ?Referring MD: Cari Caraway, MD  ? ?Chief Complaint  ?Patient presents with  ? Coronary Artery Disease  ? Hypertension  ? Hyperlipidemia  ? ? ?History of Present Illness:   ? ?Dillon Rios. is a 76 y.o. male with a hx of  ASCAD with PCI of LAD and RCA in 2006 and then again for LAD restenosis in 2007  He also has a history of dyslipidemia and mild AS by echo 12/2018.   ? ?He is here today for followup and is doing well.  He denies any chest pain or pressure, SOB, DOE, PND, orthopnea, LE edema, dizziness, palpitations or syncope. He is compliant with his meds and is tolerating meds with no SE.    ? ?Past Medical History:  ?Diagnosis Date  ? Abnormal EKG   ? left anterio fasicula rblock on 11-05-15 ekg and 10-28-14 ekg epic  ? Aortic stenosis   ? mild with mean AVG 78mHg by echo 10/22  ? Arthritis   ? Carpal tunnel syndrome   ? Bilateral  ? Coronary artery disease 10/26/2004  ? drug eluting stent placement to the mild LAD and RCA  2006/ PCI of LAD for restenosis 02/24/2006-Dr Naveen Lorusso  ? Headache   ? History of kidney stones   ? Hyperlipidemia   ? Right inguinal hernia   ? Skin cancer   ? right scalp Dr DWilhemina Bonito ? Wears glasses   ? ? ?Past Surgical History:  ?Procedure Laterality Date  ? APPENDECTOMY    ? age 76 ? CARDIAC CATHETERIZATION    ? COLONOSCOPY WITH PROPOFOL N/A 07/04/2016  ? Procedure: COLONOSCOPY WITH PROPOFOL;  Surgeon: MGarlan Fair MD;  Location: [facility];  Service: Endoscopy;  Laterality: N/A;  ? CORONARY STENT PLACEMENT  2006 and 2007  ? CYSTOSCOPY WITH RETROGRADE PYELOGRAM, URETEROSCOPY AND STENT PLACEMENT Left 06/11/2015  ? Procedure: CYSTOSCOPY WITH RETROGRADE PYELOGRAM, URETEROSCOPY AND BASKET STONE EXTRACTION STENT PLACEMENT;  Surgeon: TAlexis Frock MD;  Location: [facility];  Service: Urology;  Laterality:  Left;  ? HERNIA REPAIR  2003  ? INGUINAL HERNIA REPAIR Right 06/21/2019  ? Procedure: LAPAROSCOPIC REPAIR OF RECURRENT RIGHT INGUINAL HERNIA WITH MESH;  Surgeon: WGreer Pickerel MD;  [facility]  Service: General;  Laterality: Right;  ? melanoma [facility]    [facility] ? [facility]    ? TONSILLECTOMY    ? age 76 ? ? ?Current Medications: ?Current Meds  ?Medication Sig  ? allopurinol (ZYLOPRIM) 100 MG tablet Take 100 mg by mouth daily.  ? amLODipine (NORVASC) 5 MG tablet Take 1 tablet (5 mg total) by mouth daily. (Patient taking differently: Take 5 mg by mouth as needed. As need for BP reading greater than 150/80)  ? aspirin EC 81 MG tablet Take 1 tablet (81 mg total) by mouth daily.  ? atorvastatin (LIPITOR) 80 MG tablet TAKE 1 TABLET BY MOUTH ONCE DAILY (Patient taking differently: every evening.)  ? clopidogrel (PLAVIX) 75 MG tablet Take 75 mg by mouth daily.  ? diclofenac sodium (VOLTAREN) 1 % GEL Apply 2 g topically daily as needed (pain).   ? Liniments (BLUE-EMU SUPER STRENGTH) CREA Apply 1 application topically daily as needed (arthritis).  ? Misc Natural Products (TART CHERRY ADVANCED)  CAPS Take 1 capsule by mouth daily.   ? Multiple Vitamin (MULTIVITAMIN) tablet Take 1 tablet by mouth daily.  ? naproxen sodium (ANAPROX) 220 MG tablet Take 440 mg by mouth daily as needed (pain).  ? nitroGLYCERIN (NITROSTAT) 0.4 MG SL tablet Place 0.4 mg under the tongue every 5 (five) minutes as needed for chest pain.  ? Omega-3 Fatty Acids (FISH OIL) 1200 MG CAPS Take 1,200 mg by mouth daily.   ? oxyCODONE (OXY IR/ROXICODONE) 5 MG immediate release tablet Take 1 tablet (5 mg total) by mouth every 6 (six) hours as needed for severe pain.  ? Triamcinolone Acetonide (NASACORT AQ NA) Place 1 spray into the nose daily as needed (congestion).  ?  ? ?Allergies:   Mistletoe [viscum album]  ? ?Social History  ? ?Socioeconomic History  ? Marital status: Married  ?  Spouse name: Not on  file  ? Number of children: Not on file  ? Years of education: Not on file  ? Highest education level: Not on file  ?Occupational History  ? Not on file  ?Tobacco Use  ? Smoking status: Former  ?  Packs/day: 1.00  ?  Years: 10.00  ?  Pack years: 10.00  ?  Types: Cigarettes  ?  Quit date: 03/28/1978  ?  Years since quitting: 43.2  ? Smokeless tobacco: Never  ?Vaping Use  ? Vaping Use: Never used  ?Substance and Sexual Activity  ? Alcohol use: Yes  ?  Comment: rare  ? Drug use: No  ? Sexual activity: Not on file  ?Other Topics Concern  ? Not on file  ?Social History Narrative  ? Not on file  ? ?Social Determinants of Health  ? ?Financial Resource Strain: Not on file  ?Food Insecurity: Not on file  ?Transportation Needs: Not on file  ?Physical Activity: Not on file  ?Stress: Not on file  ?Social Connections: Not on file  ?  ? ?Family History: ?The patient's family history includes Breast cancer in his sister; CAD in his mother; Heart attack in his father. ? ?ROS:   ?Please see the history of present illness.    ?ROS  ?All other systems reviewed and negative.  ? ?EKGs/Labs/Other Studies Reviewed:   ? ?The following studies were reviewed today: ?none ? ?EKG:  EKG is  ordered today.  The ekg ordered today demonstrates NSR with LAFB ? ? ?Recent Labs: ?No results found for requested labs within last 8760 hours.  ? ?Recent Lipid Panel ?   ?Component Value Date/Time  ? CHOL 117 10/23/2017 0852  ? TRIG 67 10/23/2017 0852  ? HDL 47 10/23/2017 0852  ? CHOLHDL 2.5 10/23/2017 0852  ? CHOLHDL 3 09/13/2013 0743  ? VLDL 18.2 09/13/2013 0743  ? [place] 57 10/23/2017 0852  ? ? ?Physical Exam:   ? ?VS:  BP (!) 148/86   Pulse 70   Ht '5\' 6"'$  (1.676 m)   Wt 160 lb (72.6 kg)   SpO2 98%   BMI 25.82 kg/m?    ? ?Wt Readings from Last 3 Encounters:  ?06/03/21 160 lb (72.6 kg)  ?02/11/20 159 lb (72.1 kg)  ?06/21/19 154 lb 9.6 oz (70.1 kg)  ?  ? ?GEN: Well nourished, well developed in no acute distress ?HEENT: Normal ?NECK: No JVD; No carotid  bruits ?LYMPHATICS: No lymphadenopathy ?CARDIAC:RRR, no rubs, gallops.  2/6 SM RUSB to LLSB ?RESPIRATORY:  Clear to auscultation without rales, wheezing or rhonchi  ?ABDOMEN: Soft, non-tender, non-distended ?MUSCULOSKELETAL:  No edema; No  deformity  ?SKIN: Warm and dry ?NEUROLOGIC:  Alert and oriented x 3 ?PSYCHIATRIC:  Normal affect   ? ?ASSESSMENT:   ? ?1. Atherosclerosis of native coronary artery of native heart without angina pectoris   ?2. Mixed hyperlipidemia   ?3. Elevated blood pressure reading in office without diagnosis of hypertension   ?4. Nonrheumatic aortic valve stenosis   ? ?PLAN:   ? ?In order of problems listed above: ? ?1.  ASCAD ?-s/p PCI of LAD and RCA in 2006 and then again for LAD restenosis in 2007   ?-he denies any anginal symptoms since I saw him last ?-continue prescription drug management with ASA '81mg'$  daily, Plavix '75mg'$  daily and statin with PRN refills ? ?2.  HLD ?-LDL goal < 70 ?-continue prescription drug management with Atorvastatin '80mg'$  daily with PRN refills ? ?3.  Elevated BP ?-BP controlled on exam today ?-check FLP and ALT ?-continue prescription drug management with Amlodipine '5mg'$  daily with PRN refills ? ?4.  Aortic stenosis ?-echo 12/2020 showed mild AS with mean AVG 69mHg ?-repeat echo 10/24 ? ?Medication Adjustments/Labs and Tests Ordered: ?Current medicines are reviewed at length with the patient today.  Concerns regarding medicines are outlined above.  ?Orders Placed This Encounter  ?Procedures  ? EKG 12-Lead  ? ?No orders of the defined types were placed in this encounter. ? ? ?Signed, ?TFransico Him MD  ?06/03/2021 8:46 AM    ?[place]?

## 2021-06-03 NOTE — Telephone Encounter (Signed)
-----   Message from Loren Racer, RN sent at 06/03/2021  9:13 AM EST ----- ?Pt needs CPAP supplies for one year please ? ?

## 2021-06-03 NOTE — Addendum Note (Signed)
Addended by: Loren Racer on: 06/03/2021 08:52 AM ? ? Modules accepted: Orders ? ?

## 2021-07-19 ENCOUNTER — Ambulatory Visit: Payer: Medicare Other | Admitting: Cardiology

## 2021-12-16 ENCOUNTER — Other Ambulatory Visit: Payer: Self-pay

## 2021-12-16 ENCOUNTER — Emergency Department (HOSPITAL_COMMUNITY)
Admission: EM | Admit: 2021-12-16 | Discharge: 2021-12-16 | Disposition: A | Discharge status: Home / Self Care | Payer: No Typology Code available for payment source | Attending: Emergency Medicine | Admitting: Emergency Medicine

## 2021-12-16 ENCOUNTER — Encounter (HOSPITAL_COMMUNITY): Payer: Self-pay

## 2021-12-16 DIAGNOSIS — I10 Essential (primary) hypertension: Secondary | ICD-10-CM | POA: Insufficient documentation

## 2021-12-16 DIAGNOSIS — Z79899 Other long term (current) drug therapy: Secondary | ICD-10-CM | POA: Diagnosis not present

## 2021-12-16 DIAGNOSIS — Z7982 Long term (current) use of aspirin: Secondary | ICD-10-CM | POA: Insufficient documentation

## 2021-12-16 DIAGNOSIS — K922 Gastrointestinal hemorrhage, unspecified: Secondary | ICD-10-CM

## 2021-12-16 DIAGNOSIS — K920 Hematemesis: Secondary | ICD-10-CM | POA: Diagnosis not present

## 2021-12-16 DIAGNOSIS — K625 Hemorrhage of anus and rectum: Secondary | ICD-10-CM | POA: Diagnosis present

## 2021-12-16 DIAGNOSIS — Z7901 Long term (current) use of anticoagulants: Secondary | ICD-10-CM | POA: Diagnosis not present

## 2021-12-16 LAB — CBC WITH DIFFERENTIAL/PLATELET
Abs Immature Granulocytes: 0.02 10*3/uL (ref 0.00–0.07)
Basophils Absolute: 0.1 10*3/uL (ref 0.0–0.1)
Basophils Relative: 1 %
Eosinophils Absolute: 0.3 10*3/uL (ref 0.0–0.5)
Eosinophils Relative: 5 %
HCT: 42.7 % (ref 39.0–52.0)
Hemoglobin: 14 g/dL (ref 13.0–17.0)
Immature Granulocytes: 0 %
Lymphocytes Relative: 17 %
Lymphs Abs: 1.1 10*3/uL (ref 0.7–4.0)
MCH: 34 pg (ref 26.0–34.0)
MCHC: 32.8 g/dL (ref 30.0–36.0)
MCV: 103.6 fL — ABNORMAL HIGH (ref 80.0–100.0)
Monocytes Absolute: 0.5 10*3/uL (ref 0.1–1.0)
Monocytes Relative: 8 %
Neutro Abs: 4.6 10*3/uL (ref 1.7–7.7)
Neutrophils Relative %: 69 %
Platelets: 225 10*3/uL (ref 150–400)
RBC: 4.12 MIL/uL — ABNORMAL LOW (ref 4.22–5.81)
RDW: 13.3 % (ref 11.5–15.5)
WBC: 6.6 10*3/uL (ref 4.0–10.5)
nRBC: 0 % (ref 0.0–0.2)

## 2021-12-16 LAB — COMPREHENSIVE METABOLIC PANEL
ALT: 22 U/L (ref 0–44)
AST: 26 U/L (ref 15–41)
Albumin: 4.1 g/dL (ref 3.5–5.0)
Alkaline Phosphatase: 105 U/L (ref 38–126)
Anion gap: 7 (ref 5–15)
BUN: 24 mg/dL — ABNORMAL HIGH (ref 8–23)
CO2: 24 mmol/L (ref 22–32)
Calcium: 9 mg/dL (ref 8.9–10.3)
Chloride: 105 mmol/L (ref 98–111)
Creatinine, Ser: 1.01 mg/dL (ref 0.61–1.24)
GFR, Estimated: >60 mL/min (ref >60)
Glucose, Bld: 107 mg/dL — ABNORMAL HIGH (ref 70–99)
Potassium: 4.1 mmol/L (ref 3.5–5.1)
Sodium: 136 mmol/L (ref 135–145)
Total Bilirubin: 0.9 mg/dL (ref 0.3–1.2)
Total Protein: 7.5 g/dL (ref 6.5–8.1)

## 2021-12-16 LAB — PROTIME-INR
INR: 1 (ref 0.8–1.2)
Prothrombin Time: 13.5 seconds (ref 11.4–15.2)

## 2021-12-16 LAB — TYPE AND SCREEN
ABO/RH(D): O NEG
Antibody Screen: NEGATIVE

## 2021-12-16 LAB — POC OCCULT BLOOD, ED: Fecal Occult Bld: POSITIVE — AB

## 2021-12-16 MED ORDER — SODIUM CHLORIDE 0.9 % IV SOLN: Freq: Once | INTRAVENOUS | Status: AC

## 2021-12-16 MED ORDER — PANTOPRAZOLE SODIUM 40 MG IV SOLR
40.0000 mg | Freq: Once | INTRAVENOUS | Status: AC
  Administered 2021-12-16: 40 mg via INTRAVENOUS
  Filled 2021-12-16: qty 10

## 2021-12-16 NOTE — Discharge Instructions (Addendum)
Your appointment is scheduled with [facility] gastroenterology on September 26 at 2 PM with Dr. Daivd Council.  I will highly recommend to keep the appointment. If you start to notice blood in the stool, abdominal pain, fever, chest pain, shortness of breath or dizziness please return to emergency department for reevaluation or call 911.

## 2021-12-16 NOTE — ED Provider Notes (Signed)
[facility] DEPT Provider Note   CSN: [id number] Arrival date & time: 12/16/21  0941     History  Chief Complaint  Patient presents with   Rectal Bleeding    Dillon Rios. is a 76 y.o. male.  Dillon Rios is a 76 year old male presented to emergency department with complaints of rectal bleeding. Patient reports he woke up around 1 AM and felt gassy.  Patient attempted to pass gas and had a diarrhea.  When he woke up and turned the lights on he realized his bedsheet was stained with blood.  Patient went to the bathroom and had a bloody diarrhea.  Patient felt dizzy/lightheaded momentarily which spontaneously resolved. He denies chest pain, shortness of breath, palpitations, abdominal pain, rectal pain, constipation, fever, nausea or vomiting. Denies hematemesis.  Since then he had total of 6 bloody stools.  The last one was when he arrived in the emergency department.  He is on Plavix. No acute visible distress.  Patient is comfortably communicating.  His vital signs are stable (hemodynamically stable). Wife is at the bedside  The history is provided by the patient. No language interpreter was used.  Rectal Bleeding Quality:  Bright red Amount:  Moderate Duration:  1 day Chronicity:  New Relieved by:  Nothing Worsened by:  Nothing Ineffective treatments:  None tried Associated symptoms: dizziness   Associated symptoms: no abdominal pain, no fever and no hematemesis   Risk factors: anticoagulant use        Home Medications Prior to Admission medications   Medication Sig Start Date End Date Taking? Authorizing Provider  allopurinol (ZYLOPRIM) 100 MG tablet Take 100 mg by mouth daily. 04/08/21   [provider]  amLODipine (NORVASC) 5 MG tablet Take 1 tablet (5 mg total) by mouth daily. Patient taking differently: Take 5 mg by mouth as needed. As need for BP reading greater than 150/80 03/06/19 06/03/21  Curatolo, Adam, DO   aspirin EC 81 MG tablet Take 1 tablet (81 mg total) by mouth daily. 09/09/13   Sueanne Margarita, MD  atorvastatin (LIPITOR) 80 MG tablet TAKE 1 TABLET BY MOUTH ONCE DAILY Patient taking differently: every evening. 11/21/17   Sueanne Margarita, MD  clopidogrel (PLAVIX) 75 MG tablet Take 75 mg by mouth daily. 06/21/13   [provider]  diclofenac sodium (VOLTAREN) 1 % GEL Apply 2 g topically daily as needed (pain).     [provider]  Liniments (BLUE-EMU SUPER STRENGTH) CREA Apply 1 application topically daily as needed (arthritis).    [provider]  Misc Natural Products (TART CHERRY ADVANCED) CAPS Take 1 capsule by mouth daily.     [provider]  Multiple Vitamin (MULTIVITAMIN) tablet Take 1 tablet by mouth daily.    [provider]  naproxen sodium (ANAPROX) 220 MG tablet Take 440 mg by mouth daily as needed (pain).    [provider]  nitroGLYCERIN (NITROSTAT) 0.4 MG SL tablet Place 0.4 mg under the tongue every 5 (five) minutes as needed for chest pain.    [provider]  Omega-3 Fatty Acids (FISH OIL) 1200 MG CAPS Take 1,200 mg by mouth daily.     [provider]  oxyCODONE (OXY IR/ROXICODONE) 5 MG immediate release tablet Take 1 tablet (5 mg total) by mouth every 6 (six) hours as needed for severe pain. 06/21/19   Greer Pickerel, MD  Triamcinolone Acetonide (NASACORT AQ NA) Place 1 spray into the nose daily as needed (congestion).  [provider]      Allergies    Mistletoe [viscum album]    Review of Systems   Review of Systems  Constitutional:  Negative for fever.  Gastrointestinal:  Positive for blood in stool and hematochezia. Negative for abdominal pain and hematemesis.  Neurological:  Positive for dizziness.    Physical Exam Updated Vital Signs BP 136/69   Pulse 66   Temp 98.2 F (36.8 C) (Oral)   Resp 18   Ht '5\' 5"'$  (1.651 m)   Wt 73 kg   SpO2 99%   BMI 26.79 kg/m  Physical Exam Vitals  and nursing note reviewed.  Constitutional:      Appearance: Normal appearance.  Eyes:     Conjunctiva/sclera: Conjunctivae normal.  Cardiovascular:     Rate and Rhythm: Normal rate and regular rhythm.  Pulmonary:     Effort: Pulmonary effort is normal.     Breath sounds: Normal breath sounds.  Abdominal:     General: Bowel sounds are normal. There is no distension.     Palpations: Abdomen is soft. There is no mass.     Tenderness: There is no abdominal tenderness. There is no guarding or rebound.     Hernia: No hernia is present.     Comments: Abdomen soft, nondistended, nontender.  No guarding, rebound or rigidity appreciated.  Good bowel sounds present all 4 quadrants.  There is no pulsation, masses or bruising noted.  Musculoskeletal:     Cervical back: Neck supple.  Skin:    General: Skin is warm.     Capillary Refill: Capillary refill takes less than 2 seconds.  Neurological:     Mental Status: He is alert and oriented to person, place, and time.  Psychiatric:     Comments: Anxious     ED Results / Procedures / Treatments   Labs (all labs ordered are listed, but only abnormal results are displayed) Labs Reviewed  COMPREHENSIVE METABOLIC PANEL - Abnormal; Notable for the following components:      Result Value   Glucose, Bld 107 (*)    BUN 24 (*)    All other components within normal limits  CBC WITH DIFFERENTIAL/PLATELET - Abnormal; Notable for the following components:   RBC 4.12 (*)    MCV 103.6 (*)    All other components within normal limits  POC OCCULT BLOOD, ED - Abnormal; Notable for the following components:   Fecal Occult Bld POSITIVE (*)    All other components within normal limits  PROTIME-INR  TYPE AND SCREEN  ABO/RH    EKG None  Radiology No results found.  Procedures Procedures    Medications Ordered in ED Medications  pantoprazole (PROTONIX) injection 40 mg (40 mg Intravenous Given 12/16/21 1102)  0.9 %  sodium chloride infusion (0 mLs  Intravenous Stopped 12/16/21 1326)    ED Course/ Medical Decision Making/ A&P                           Medical Decision Making 76 year old male with history of coronary artery disease on Plavix, hyperlipidemia, hypertension presented to emergency department with wife with complaints of rectal bleeding.  Patient reports he felt gassy around 1 AM and when he passed gas he was incontinent with stool.  When he woke up he realized his bedsheet was stained with blood.  He went to the bathroom and had a bloody diarrhea.  He is reporting the blood is of bright red.  Patient felt dizzy, lightheaded momentarily which spontaneously resolved.  He denies abdominal pain, nausea, vomiting, hematemesis, rectal pain, fever, chills.  He denies chest pain, shortness of breath, palpitations.  On exam patient appears anxious.  He is comfortably communicating with no acute visible distress. Mucous membranes moist.  Abdomen soft, nontender, nondistended.  No guarding or rebound tenderness.  No pulsation, masses or bruising appreciated.  S1-S2 normal without murmur or gallop. Rectal exam demonstrated external hemorrhoid, nontender nonthrombosed on exam.  No rectal fissure.  Rectal exam was performed without pronounced pain or discomfort.  There was obvious blood noted in the rectal vault.   Stool card for occult blood was completed and sent to the lab.  GI bleed evaluation and treatment was initiated.  He will get 2 large-bore IV lines, IV fluids, Protonix, type and screen, CBC, CMP. We will consult GI once we have the lab results. We will continue to monitor the patient.  Patient was reevaluated at 1108.  Patient stated he had another bowel movement which was more solid looking and less blood.  Patient is comfortably laying in bed.  No acute visible distress.  His vital signs are stable.Fecal occult blood results were positive.  Patient was reevaluated at 105: Patient remains asymptomatic.  Patient reports he had  another bowel movement where the stool appeared like bile without the blood.  It appears like the GI bleeding has spontaneously resolved.  It could possibly be diverticular bleed.  Pt denies abdominal pain, chest pain, shortness of breath, dizziness. he is hemodynamically stable.  I have reviewed the lab work with the patient.  His H&H is 14 and 44 respectively.  Normal coagulation studies.  Patient completed a bolus of IV fluid and Protonix.  I called [facility] and to see Dr. Daivd Council on September 26 at 2 PM.  Patient was informed about the appointment. Detailed discussion about the red flag signs: Worsening or new Sx, blood in the stool, chest pain, shortness of breath, dizziness, abdominal pain or fever, if noted patient will return to emergency room for reevaluation.  Patient is discharged home in a stable condition.  He will follow-up PCP in 2 days.  Patient agrees with the plan of care.    Amount and/or Complexity of Data Reviewed Labs: ordered. Radiology: ordered.  Risk Prescription drug management.          Final Clinical Impression(s) / ED Diagnoses Final diagnoses:  Acute GI bleeding    Rx / DC Orders ED Discharge Orders     None         Teola Bradley, MD 12/16/21 1419    Carmin Muskrat, MD 12/16/21 [phone]

## 2021-12-16 NOTE — ED Triage Notes (Signed)
0100 pt noticed some rectal bleeding. Pt thought it was gas. Also having diarrhea with it.

## 2021-12-22 ENCOUNTER — Telehealth: Payer: Self-pay | Admitting: *Deleted

## 2021-12-22 ENCOUNTER — Telehealth: Payer: Self-pay

## 2021-12-22 NOTE — Telephone Encounter (Signed)
   Pre-operative Risk Assessment    Patient Name: Dillon Rios.  DOB: 14-Aug-1945 MRN: [id number]      Request for Surgical Clearance    Procedure:   COLONOSCOPY  Date of Surgery:  Clearance 02/07/22                                 Surgeon:  DR. Randel Pigg Surgeon's Group or Practice Name:  [facility] Phone number:  [phone] Fax number:  [phone]   Type of Clearance Requested:   - Medical  - Pharmacy:  Hold Clopidogrel (Plavix) X'S 5 DAYS    Type of Anesthesia:   PROPOFOL   Additional requests/questions:    [name]   12/22/2021, 7:05 AM

## 2021-12-22 NOTE — Telephone Encounter (Signed)
  Patient Consent for Virtual Visit        Dillon Rios. has provided verbal consent on 12/22/2021 for a virtual visit (video or telephone).   CONSENT FOR VIRTUAL VISIT FOR:  Dillon Rios.  By participating in this virtual visit I agree to the following:  I hereby voluntarily request, consent and authorize [facility] and its employed or contracted physicians, physician assistants, nurse practitioners or other licensed health care professionals (the Practitioner), to provide me with telemedicine health care services (the "Services") as deemed necessary by the treating Practitioner. I acknowledge and consent to receive the Services by the Practitioner via telemedicine. I understand that the telemedicine visit will involve communicating with the Practitioner through live audiovisual communication technology and the disclosure of certain medical information by electronic transmission. I acknowledge that I have been given the opportunity to request an in-person assessment or other available alternative prior to the telemedicine visit and am voluntarily participating in the telemedicine visit.  I understand that I have the right to withhold or withdraw my consent to the use of telemedicine in the course of my care at any time, without affecting my right to future care or treatment, and that the Practitioner or I may terminate the telemedicine visit at any time. I understand that I have the right to inspect all information obtained and/or recorded in the course of the telemedicine visit and may receive copies of available information for a reasonable fee.  I understand that some of the potential risks of receiving the Services via telemedicine include:  Delay or interruption in medical evaluation due to technological equipment failure or disruption; Information transmitted may not be sufficient (e.g. poor resolution of images) to allow for appropriate medical decision making by the  Practitioner; and/or  In rare instances, security protocols could fail, causing a breach of personal health information.  Furthermore, I acknowledge that it is my responsibility to provide information about my medical history, conditions and care that is complete and accurate to the best of my ability. I acknowledge that Practitioner's advice, recommendations, and/or decision may be based on factors not within their control, such as incomplete or inaccurate data provided by me or distortions of diagnostic images or specimens that may result from electronic transmissions. I understand that the practice of medicine is not an exact science and that Practitioner makes no warranties or guarantees regarding treatment outcomes. I acknowledge that a copy of this consent can be made available to me via my patient portal ([facility]), or I can request a printed copy by calling the office of [facility].    I understand that my insurance will be billed for this visit.   I have read or had this consent read to me. I understand the contents of this consent, which adequately explains the benefits and risks of the Services being provided via telemedicine.  I have been provided ample opportunity to ask questions regarding this consent and the Services and have had my questions answered to my satisfaction. I give my informed consent for the services to be provided through the use of telemedicine in my medical care

## 2021-12-22 NOTE — Telephone Encounter (Signed)
Spoke with patient who is agreeable to do a virtual visit on 10/16 at 2:40 pm. Med rec and consent have been done.

## 2021-12-22 NOTE — Telephone Encounter (Signed)
   Name: Dillon Rios.  DOB: 13-Jun-1945  MRN: [id number]  Primary Cardiologist: Fransico Him, MD   Preoperative team, please contact this patient and set up a phone call appointment for further preoperative risk assessment. Please obtain consent and complete medication review. Thank you for your help.  I confirm that guidance regarding antiplatelet and oral anticoagulation therapy has been completed and, if necessary, noted below.  Per office protocol, if patient is without any new symptoms or concerns at the time of their virtual visit, he may hold Plavix for 5 days prior to procedure. Please resume PLavix as soon as possible postprocedure, at the discretion of the surgeon.     Lenna Sciara, NP 12/22/2021, 11:57 AM [place]

## 2021-12-23 ENCOUNTER — Telehealth: Payer: Self-pay | Admitting: Cardiology

## 2021-12-23 NOTE — Telephone Encounter (Signed)
Pt c/o medication issue:  1. Name of Medication:  aspirin EC 81 MG tablet clopidogrel (PLAVIX) 75 MG tablet  2. How are you currently taking this medication (dosage and times per day)? 1 tablet daily of both  3. Are you having a reaction (difficulty breathing--STAT)? no  4. What is your medication issue? Patient states he has been taking both. He says his [place] doctor wanted to know if he needs to continue both, because usually patients will only take one of them.

## 2021-12-23 NOTE — Telephone Encounter (Signed)
Patient called to report that he was treated at the ED on 12/16/2021 for rectal bleeding and bloody diarrhea. This started at 01:00 and subsided while in the ED about 11:00 am. He was discharged with follow up scheduled with gastroenterology. When he as seen by his [name], she questioned why he was on aspirin and Plavix together.  He is scheduled for colonoscopy 02/18/2012.  Patient also reported [facility] stopped his amlodipine and started him on Losartan '50mg'$  daily.  Patient wanted to make [name] aware of all of the above.

## 2021-12-23 NOTE — Telephone Encounter (Signed)
Follow up call to patient. Per Dr. Gasper Sells, patient is to stop taking Plavix and aspirin until cleared by gastroenterology.  Patient verbalized understanding and was appreciative of call.

## 2022-01-10 ENCOUNTER — Encounter: Payer: Self-pay | Admitting: Nurse Practitioner

## 2022-01-10 ENCOUNTER — Ambulatory Visit: Payer: No Typology Code available for payment source | Attending: Cardiology | Admitting: Nurse Practitioner

## 2022-01-10 DIAGNOSIS — Z01810 Encounter for preprocedural cardiovascular examination: Secondary | ICD-10-CM | POA: Diagnosis not present

## 2022-01-10 NOTE — Progress Notes (Signed)
Virtual Visit via Telephone Note   Because of Dillon KRALL Jr.'s co-morbid illnesses, he is at least at moderate risk for complications without adequate follow up.  This format is felt to be most appropriate for this patient at this time.  The patient did not have access to video technology/had technical difficulties with video requiring transitioning to audio format only (telephone).  All issues noted in this document were discussed and addressed.  No physical exam could be performed with this format.  Please refer to the patient's chart for his consent to telehealth for [facility].  Evaluation Performed:  Preoperative cardiovascular risk assessment _____________   Date:  01/10/2022   Patient ID:  Dillon Rios., DOB 17-Apr-1945, MRN [id number] Patient Location:  Home Provider location:   Office  Primary Care Provider:  Clinic, [facility] Primary Cardiologist:  Fransico Him, MD  Chief Complaint / Patient Profile   76 y.o. y/o male with a h/o CAD s/p PCI-LAD and RCA in 2006, [place] in 2007 (ISR), hypertension, hyperlipidemia, and aortic valve stenosis who is pending colonoscopy on 02/07/2022 with Dr. Randel Pigg of [facility] and presents today for telephonic preoperative cardiovascular risk assessment.  Past Medical History    Past Medical History:  Diagnosis Date   Abnormal EKG    left anterio fasicula rblock on 11-05-15 ekg and 10-28-14 ekg epic   Aortic stenosis    mild with mean AVG 84mHg by echo 10/22   Arthritis    Carpal tunnel syndrome    Bilateral   Coronary artery disease 10/26/2004   drug eluting stent placement to the mild LAD and RCA  2006/ PCI of LAD for restenosis 02/24/2006-Dr Turner   Headache    History of kidney stones    Hyperlipidemia    Right inguinal hernia    Skin cancer    right scalp Dr DWilhemina Bonito  Wears glasses    Past Surgical History:  Procedure Laterality Date   APPENDECTOMY     age 76  CARDIAC CATHETERIZATION      COLONOSCOPY WITH PROPOFOL N/A 07/04/2016   Procedure: COLONOSCOPY WITH PROPOFOL;  Surgeon: MGarlan Fair MD;  Location: [facility];  Service: Endoscopy;  Laterality: N/A;   CORONARY STENT PLACEMENT  2006 and 2007   CYSTOSCOPY WITH RETROGRADE PYELOGRAM, URETEROSCOPY AND STENT PLACEMENT Left 06/11/2015   Procedure: CYSTOSCOPY WITH RETROGRADE PYELOGRAM, URETEROSCOPY AND BASKET STONE EXTRACTION STENT PLACEMENT;  Surgeon: TAlexis Frock MD;  Location: [facility];  Service: Urology;  Laterality: Left;   HERNIA REPAIR  2003   INGUINAL HERNIA REPAIR Right 06/21/2019   Procedure: LAPAROSCOPIC REPAIR OF RECURRENT RIGHT INGUINAL HERNIA WITH MESH;  Surgeon: WGreer Pickerel MD;  [facility]  Service: General;  Laterality: Right;   melanoma removal     from left forearm-Dr Houston 2013   MOUTH SURGERY     TONSILLECTOMY     age 76   Allergies  Allergies  Allergen Reactions   Mistletoe [Viscum Album] Hives    History of Present Illness    [name] is a 76y.o. male who presents via audio/video conferencing for a telehealth visit today.  Pt was last seen in cardiology clinic on 06/03/2021 by Dr.Turner.  At that time Dillon Rios was doing well. The patient is now pending procedure as outlined above. Since his last visit, he has been stable from a cardiac standpoint. He denies chest pain, palpitations, dyspnea, pnd, orthopnea, n,  v, dizziness, syncope, edema, weight gain, or early satiety. All other systems reviewed and are otherwise negative except as noted above.   Home Medications    Prior to Admission medications   Medication Sig Start Date End Date Taking? Authorizing Provider  allopurinol (ZYLOPRIM) 100 MG tablet Take 100 mg by mouth daily. 04/08/21   [provider]  aspirin EC 81 MG tablet Take 1 tablet (81 mg total) by mouth daily. 09/09/13   Sueanne Margarita, MD  atorvastatin (LIPITOR) 80 MG tablet TAKE 1 TABLET BY MOUTH ONCE DAILY Patient  taking differently: every evening. 11/21/17   Sueanne Margarita, MD  clopidogrel (PLAVIX) 75 MG tablet Take 75 mg by mouth daily. 06/21/13   [provider]  diclofenac sodium (VOLTAREN) 1 % GEL Apply 2 g topically daily as needed (pain).     [provider]  Liniments (BLUE-EMU SUPER STRENGTH) CREA Apply 1 application topically daily as needed (arthritis).    [provider]  losartan (COZAAR) 50 MG tablet Take 50 mg by mouth daily.    [provider]  Misc Natural Products (TART CHERRY ADVANCED) CAPS Take 1 capsule by mouth daily.     [provider]  Multiple Vitamin (MULTIVITAMIN) tablet Take 1 tablet by mouth daily.    [provider]  naproxen sodium (ANAPROX) 220 MG tablet Take 440 mg by mouth daily as needed (pain).    [provider]  nitroGLYCERIN (NITROSTAT) 0.4 MG SL tablet Place 0.4 mg under the tongue every 5 (five) minutes as needed for chest pain.    [provider]  Omega-3 Fatty Acids (FISH OIL) 1200 MG CAPS Take 1,200 mg by mouth daily.     [provider]  oxyCODONE (OXY IR/ROXICODONE) 5 MG immediate release tablet Take 1 tablet (5 mg total) by mouth every 6 (six) hours as needed for severe pain. 06/21/19   Greer Pickerel, MD  Triamcinolone Acetonide (NASACORT AQ NA) Place 1 spray into the nose daily as needed (congestion).    [provider]    Physical Exam    Vital Signs:  Shana Younge. does not have vital signs available for review today.  Given telephonic nature of communication, physical exam is limited. AAOx3. NAD. Normal affect.  Speech and respirations are unlabored.  Accessory Clinical Findings    None  Assessment & Plan    1.  Preoperative Cardiovascular Risk Assessment: According to the Revised Cardiac Risk Index (RCRI), his Perioperative Risk of Major Cardiac Event is (%): 0.9. His Functional Capacity in METs is: 8.97 according to the Duke Activity Status Index  (DASI). Therefore, based on ACC/AHA guidelines, patient would be at acceptable risk for the planned procedure without further cardiovascular testing.   The patient was advised that if he develops new symptoms prior to surgery to contact our office to arrange for a follow-up visit, and he verbalized understanding.  Per Dr. Gasper Sells (see telephone encounter dated 12/23/2021), given recent GI bleed, DAPT is on hold until patient is cleared by GI. Once cleared by GI, please resume aspirin monotherapy with ASA 81 mg daily.  A copy of this note will be routed to requesting surgeon.  Time:   Today, I have spent 6 minutes with the patient with telehealth technology discussing medical history, symptoms, and management plan.     Lenna Sciara, NP  01/10/2022, 3:23 PM

## 2022-02-15 ENCOUNTER — Telehealth: Payer: Self-pay | Admitting: Cardiology

## 2022-02-15 NOTE — Telephone Encounter (Signed)
Patient is calling to talk to [name] or nurse. Please call back

## 2022-02-15 NOTE — Telephone Encounter (Signed)
Patient wants to know if he needs to resume Plavix. He states he was diagnosed with diverticulosis as the source of his bleeding from colonoscopy with Dr. Randel Pigg.  He was instructed to restart aspirin 81 mg daily. I advised I would forward his question to [name] for review.

## 2022-02-21 NOTE — Telephone Encounter (Signed)
Left message for patient to call back  

## 2022-02-28 NOTE — Telephone Encounter (Signed)
Pt aware of recommendations and agrees ./cy

## 2022-02-28 NOTE — Telephone Encounter (Signed)
Lm to call back ./cy 

## 2022-06-09 LAB — LAB REPORT - SCANNED
A1c: 5.9
Creatinine, POC: 161 mg/dL
EGFR: 71
Microalb Creat Ratio: 17.6
Microalbumin, Urine: 2.83

## 2022-09-02 ENCOUNTER — Encounter: Payer: Self-pay | Admitting: Cardiology

## 2022-09-02 ENCOUNTER — Ambulatory Visit: Payer: Medicare Other | Attending: Cardiology | Admitting: Cardiology

## 2022-09-02 VITALS — BP 158/60 | HR 61 | Ht 65.0 in | Wt 163.3 lb

## 2022-09-02 DIAGNOSIS — I10 Essential (primary) hypertension: Secondary | ICD-10-CM | POA: Diagnosis not present

## 2022-09-02 DIAGNOSIS — I350 Nonrheumatic aortic (valve) stenosis: Secondary | ICD-10-CM

## 2022-09-02 DIAGNOSIS — E782 Mixed hyperlipidemia: Secondary | ICD-10-CM | POA: Diagnosis not present

## 2022-09-02 DIAGNOSIS — I2510 Atherosclerotic heart disease of native coronary artery without angina pectoris: Secondary | ICD-10-CM

## 2022-09-02 NOTE — Patient Instructions (Signed)
Medication Instructions:  Your physician recommends that you continue on your current medications as directed. Please refer to the Current Medication list given to you today.  *If you need a refill on your cardiac medications before your next appointment, please call your pharmacy*   Lab Work: [facility] and have them send [place] a copy of your recent Fasting lipid panel and ALT. Our fax number is [phone].  If you have labs (blood work) drawn today and your tests are completely normal, you will receive your results only by: MyChart Message (if you have MyChart) OR A paper copy in the mail If you have any lab test that is abnormal or we need to change your treatment, we will call you to review the results.   Testing/Procedures: Your physician has requested that you have an echocardiogram in September 2024. Echocardiography is a painless test that uses sound waves to create images of your heart. It provides your doctor with information about the size and shape of your heart and how well your heart's chambers and valves are working. This procedure takes approximately one hour. There are no restrictions for this procedure. Please do NOT wear cologne, perfume, aftershave, or lotions (deodorant is allowed). Please arrive 15 minutes prior to your appointment time.     Follow-Up: At [facility], you and your health needs are our priority.  As part of our continuing mission to provide you with exceptional heart care, we have created designated Provider Care Teams.  These Care Teams include your primary Cardiologist (physician) and Advanced Practice Providers (APPs -  Physician Assistants and Nurse Practitioners) who all work together to provide you with the care you need, when you need it.  We recommend signing up for the patient portal called "MyChart".  Sign up information is provided on this After Visit Summary.  MyChart is used to connect with patients for Virtual  Visits (Telemedicine).  Patients are able to view lab/test results, encounter notes, upcoming appointments, etc.  Non-urgent messages can be sent to your provider as well.   To learn more about what you can do with MyChart, go to [email or web address].    Your next appointment:   1 year(s)  Provider:   Armanda Magic, MD

## 2022-09-02 NOTE — Addendum Note (Signed)
Addended by: Luellen Pucker on: 09/02/2022 10:24 AM   Modules accepted: Orders

## 2022-09-02 NOTE — Progress Notes (Signed)
Cardiology Office Note:    Date:  09/02/2022   ID:  Izola Price., DOB May 25, 1945, MRN [id number]  PCP:  [facility]  Cardiologist:  Armanda Magic, MD    Referring MD: [facility]   Chief Complaint  Patient presents with   Coronary Artery Disease   Hypertension   Hyperlipidemia   Aortic Stenosis    History of Present Illness:    Parley Rufenacht. is a 77 y.o. male with a hx of  ASCAD with PCI of LAD and RCA in 2006 and then again for LAD restenosis in 2007  He also has a history of dyslipidemia and mild AS by echo 12/2018.    He is here today for followup and is doing well.  He denies any chest pain or pressure, SOB, DOE, PND, orthopnea, LE edema, dizziness, palpitations or syncope. He is compliant with his meds and is tolerating meds with no SE.    Past Medical History:  Diagnosis Date   Abnormal EKG    left anterio fasicula rblock on 11-05-15 ekg and 10-28-14 ekg epic   Aortic stenosis    mild with mean AVG by echo 10/22   Arthritis    Carpal tunnel syndrome    Bilateral   Coronary artery disease 10/26/2004   drug eluting stent placement to the mild LAD and RCA  2006/ PCI of LAD for restenosis 02/24/2006-Dr Shuan Statzer   Headache    History of kidney stones    Hyperlipidemia    Right inguinal hernia    Skin cancer    right scalp Dr Karlyn Agee   Wears glasses     Past Surgical History:  Procedure Laterality Date   APPENDECTOMY     age 38   CARDIAC CATHETERIZATION     COLONOSCOPY WITH PROPOFOL N/A 07/04/2016   Procedure: COLONOSCOPY WITH PROPOFOL;  Surgeon: Charolett Bumpers, MD;  Location: [facility];  Service: Endoscopy;  Laterality: N/A;   CORONARY STENT PLACEMENT  2006 and 2007   CYSTOSCOPY WITH RETROGRADE PYELOGRAM, URETEROSCOPY AND STENT PLACEMENT Left 06/11/2015   Procedure: CYSTOSCOPY WITH RETROGRADE PYELOGRAM, URETEROSCOPY AND BASKET STONE EXTRACTION STENT PLACEMENT;  Surgeon: Sebastian Ache, MD;  Location: [facility];  Service:  Urology;  Laterality: Left;   HERNIA REPAIR  2003   INGUINAL HERNIA REPAIR Right 06/21/2019   Procedure: LAPAROSCOPIC REPAIR OF RECURRENT RIGHT INGUINAL HERNIA WITH MESH;  Surgeon: Gaynelle Adu, MD;  [facility]: [facility];  Service: General;  Laterality: Right;   melanoma removal     from left forearm-Dr [facility]   MOUTH SURGERY     TONSILLECTOMY     age 86    Current Medications: Current Meds  Medication Sig   allopurinol (ZYLOPRIM) 100 MG tablet Take 100 mg by mouth daily.   aspirin EC 81 MG tablet Take 1 tablet (81 mg total) by mouth daily.   atorvastatin (LIPITOR) 80 MG tablet TAKE 1 TABLET BY MOUTH ONCE DAILY (Patient taking differently: every evening.)   diclofenac sodium (VOLTAREN) 1 % GEL Apply 2 g topically daily as needed (pain).    Liniments (BLUE-EMU SUPER STRENGTH) CREA Apply 1 application topically daily as needed (arthritis).   losartan (COZAAR) 50 MG tablet Take 50 mg by mouth daily.   Misc Natural Products (TART CHERRY ADVANCED) CAPS Take 1 capsule by mouth daily.    Multiple Vitamin (MULTIVITAMIN) tablet Take 1 tablet by mouth daily.   naproxen sodium (ANAPROX) 220 MG tablet Take 440 mg  by mouth daily as needed (pain).   nitroGLYCERIN (NITROSTAT) 0.4 MG SL tablet Place 0.4 mg under the tongue every 5 (five) minutes as needed for chest pain.   Omega-3 Fatty Acids (FISH OIL) 1200 MG CAPS Take 1,200 mg by mouth daily.    oxyCODONE (OXY IR/ROXICODONE) 5 MG immediate release tablet Take 1 tablet (5 mg total) by mouth every 6 (six) hours as needed for severe pain.   Triamcinolone Acetonide (NASACORT AQ NA) Place 1 spray into the nose daily as needed (congestion).     Allergies:   [place] [viscum album]   Social History   Socioeconomic History   Marital status: Married    Spouse name: Not on file   Number of children: Not on file   Years of education: Not on file   Highest education level: Not on file  Occupational History   Not on file  Tobacco  Use   Smoking status: Former    Packs/day: 1.00    Years: 10.00    Additional pack years: 0.00    Total pack years: 10.00    Types: Cigarettes    Quit date: 03/28/1978    Years since quitting: 44.4   Smokeless tobacco: Never  Vaping Use   Vaping Use: Never used  Substance and Sexual Activity   Alcohol use: Yes    Comment: rare   Drug use: No   Sexual activity: Not on file  Other Topics Concern   Not on file  Social History Narrative   Not on file   Social Determinants of Health   Financial Resource Strain: Not on file  Food Insecurity: Not on file  Transportation Needs: Not on file  Physical Activity: Not on file  Stress: Not on file  Social Connections: Not on file     Family History: The patient's family history includes Breast cancer in his sister; CAD in his mother; Heart attack in his father.  ROS:   Please see the history of present illness.    ROS  All other systems reviewed and negative.   EKGs/Labs/Other Studies Reviewed:    The following studies were reviewed today: none  EKG:  EKG is  ordered today.  The ekg ordered today demonstrates NSR with LAFB   Recent Labs: 12/16/2021: ALT 22; BUN 24; Creatinine, Ser 1.01; Hemoglobin 14.0; Platelets 225; Potassium 4.1; Sodium 136   Recent Lipid Panel    Component Value Date/Time   CHOL 132 06/03/2021 0859   TRIG 73 06/03/2021 0859   HDL 61 06/03/2021 0859   CHOLHDL 2.2 06/03/2021 0859   CHOLHDL 3 09/13/2013 0743   VLDL 18.2 09/13/2013 0743   LDLCALC 56 06/03/2021 0859    Physical Exam:    VS:  BP (!) 158/60   Pulse 61   Ht 5\' 5"  (1.651 m)   Wt 163 lb 4.8 oz (74.1 kg)   SpO2 98%   BMI 27.17 kg/m     Wt Readings from Last 3 Encounters:  09/02/22 163 lb 4.8 oz (74.1 kg)  12/16/21 161 lb (73 kg)  06/03/21 160 lb (72.6 kg)     GEN: Well nourished, well developed in no acute distress HEENT: Normal NECK: No JVD; No carotid bruits LYMPHATICS: No lymphadenopathy CARDIAC:RRR, no rubs, gallops.   2/6 SM at RUSB to LUSB and into left carotid RESPIRATORY:  Clear to auscultation without rales, wheezing or rhonchi  ABDOMEN: Soft, non-tender, non-distended MUSCULOSKELETAL:  No edema; No deformity  SKIN: Warm and dry NEUROLOGIC:  Alert and  oriented x 3 PSYCHIATRIC:  Normal affect   ASSESSMENT:    1. Atherosclerosis of native coronary artery of native heart without angina pectoris   2. Mixed hyperlipidemia   3. Nonrheumatic aortic valve stenosis   4. Benign essential HTN    PLAN:    In order of problems listed above:  1.  ASCAD -s/p PCI of LAD and RCA in 2006 and then again for LAD restenosis in 2007   -He has not had any general symptoms since I saw him last -Continue prescription drug management with aspirin 81 mg daily, atorvastatin 80 mg daily with as needed refills -Plavix was stopped due to GI bleed and on monotherapy with ASA  2.  HLD -LDL goal < 70 -I will request a copy of lipids recently done [facility] -Continue drug management with atorvastatin 80 mg daily with as needed refills  3.  HTN -BP is borderline controlled on exam today but at home his SBP runs 120-130's -Continue prescription management with losartan 50 mg daily with as needed refills -I have personally reviewed and interpreted outside labs performed by patient's PCP which showed serum creatinine 1.01 and potassium 4.1 on 12/16/2021  4.  Aortic stenosis -echo 12/2020 showed mild AS with mean AVG -repeat echo 10/24  Medication Adjustments/Labs and Tests Ordered: Current medicines are reviewed at length with the patient today.  Concerns regarding medicines are outlined above.  Orders Placed This Encounter  Procedures   EKG 12-Lead   No orders of the defined types were placed in this encounter.   Signed, Armanda Magic, MD  09/02/2022 10:15 AM    [facility]

## 2022-10-18 ENCOUNTER — Telehealth: Payer: Self-pay | Admitting: Cardiology

## 2022-10-18 ENCOUNTER — Encounter: Payer: Self-pay | Admitting: Cardiology

## 2022-10-18 NOTE — Telephone Encounter (Signed)
Error

## 2022-10-18 NOTE — Telephone Encounter (Signed)
Patient would like to talk to Dr. Norris Cross nurse in regards to labs from [place].

## 2022-10-18 NOTE — Telephone Encounter (Signed)
Patient notifying us that he has had his recent labs done at [facility] mailed to [place].

## 2022-10-27 LAB — LAB REPORT - SCANNED
A1c: 6
EGFR: 76

## 2022-11-29 ENCOUNTER — Other Ambulatory Visit (HOSPITAL_COMMUNITY): Payer: No Typology Code available for payment source

## 2022-11-29 ENCOUNTER — Telehealth: Payer: Self-pay | Admitting: Cardiology

## 2022-11-29 ENCOUNTER — Encounter: Payer: Self-pay | Admitting: Cardiology

## 2022-11-29 ENCOUNTER — Ambulatory Visit (HOSPITAL_COMMUNITY): Payer: Medicare Other | Attending: Cardiology

## 2022-11-29 DIAGNOSIS — I350 Nonrheumatic aortic (valve) stenosis: Secondary | ICD-10-CM | POA: Diagnosis present

## 2022-11-29 LAB — ECHOCARDIOGRAM COMPLETE
AR max vel: 1.16 cm2
AV Area VTI: 1.15 cm2
AV Area mean vel: 1.16 cm2
AV Mean grad: 17 mmHg
AV Peak grad: 30 mmHg
Ao pk vel: 2.74 m/s
Area-P 1/2: 2.77 cm2
P 1/2 time: 307 ms
S' Lateral: 2.6 cm

## 2022-11-29 NOTE — Telephone Encounter (Signed)
PT dropped off all his lab results at the front desk for Dr Mayford Knife.                               Date  11/29/2022

## 2022-12-01 ENCOUNTER — Other Ambulatory Visit (HOSPITAL_COMMUNITY): Payer: No Typology Code available for payment source

## 2022-12-02 ENCOUNTER — Telehealth: Payer: Self-pay

## 2022-12-02 DIAGNOSIS — I350 Nonrheumatic aortic (valve) stenosis: Secondary | ICD-10-CM

## 2022-12-02 NOTE — Telephone Encounter (Signed)
-----   Message from Armanda Magic sent at 11/29/2022  8:12 PM EDT ----- Echo with EF 60-65%, mild AS with mean AVG .  Repeat echo in 1 year for AS

## 2022-12-02 NOTE — Telephone Encounter (Signed)
Called patient to discuss echo with EF 60-65%, mild AS with mean AVG .  No answer, left detailed message per DPR explaining results and asking patient to call our office if any questions. Order for repeat echo in 1 year for AS placed.

## 2023-11-02 ENCOUNTER — Other Ambulatory Visit: Payer: Self-pay

## 2023-11-02 DIAGNOSIS — R011 Cardiac murmur, unspecified: Secondary | ICD-10-CM

## 2023-11-02 DIAGNOSIS — I350 Nonrheumatic aortic (valve) stenosis: Secondary | ICD-10-CM

## 2023-12-07 ENCOUNTER — Ambulatory Visit (HOSPITAL_COMMUNITY)
Admission: RE | Admit: 2023-12-07 | Discharge: 2023-12-07 | Disposition: A | Discharge status: Home / Self Care | Source: Ambulatory Visit | Attending: Cardiology | Admitting: Cardiology

## 2023-12-07 ENCOUNTER — Ambulatory Visit: Payer: Self-pay | Admitting: Cardiology

## 2023-12-07 DIAGNOSIS — I352 Nonrheumatic aortic (valve) stenosis with insufficiency: Secondary | ICD-10-CM | POA: Insufficient documentation

## 2023-12-07 DIAGNOSIS — R011 Cardiac murmur, unspecified: Secondary | ICD-10-CM | POA: Diagnosis not present

## 2023-12-07 DIAGNOSIS — E785 Hyperlipidemia, unspecified: Secondary | ICD-10-CM | POA: Diagnosis not present

## 2023-12-07 DIAGNOSIS — I350 Nonrheumatic aortic (valve) stenosis: Secondary | ICD-10-CM | POA: Insufficient documentation

## 2023-12-07 DIAGNOSIS — R9431 Abnormal electrocardiogram [ECG] [EKG]: Secondary | ICD-10-CM | POA: Insufficient documentation

## 2023-12-07 LAB — ECHOCARDIOGRAM COMPLETE
AR max vel: 1.17 cm2
AV Area VTI: 1.24 cm2
AV Area mean vel: 1.21 cm2
AV Mean grad: 18 mmHg
AV Peak grad: 30.3 mmHg
Ao pk vel: 2.75 m/s
Area-P 1/2: 2.89 cm2
S' Lateral: 2 cm

## 2024-01-08 NOTE — Telephone Encounter (Signed)
-----   Message from Wilbert Bihari sent at 12/07/2023  2:37 PM EDT ----- Echo showed normal pumping function of the heart muscle EF 60-65% with mild to moderate aortic stenosis.  Mean AVG .  No significant change from prior echo ----- Message ----- From: Interface, [employer] Sent: 12/07/2023   1:14 PM EDT To: Wilbert JONELLE Bihari, MD

## 2024-01-08 NOTE — Telephone Encounter (Signed)
 Call to patient to advise that Echo showed normal pumping function of the heart muscle EF 60-65% with mild to moderate aortic stenosis.   Patient verbalizes understanding of no significant change from prior echo
